# Patient Record
Sex: Male | Born: 1955 | Race: White | Hispanic: No | Marital: Married
Health system: Midwestern US, Academic
[De-identification: ages and names within clinical notes are randomized; demographics above are authoritative.]

---

## 2017-07-05 ENCOUNTER — Encounter: Admit: 2017-07-05 | Discharge: 2017-07-05 | Payer: BC Managed Care – PPO

## 2017-07-05 ENCOUNTER — Ambulatory Visit: Admit: 2017-07-05 | Discharge: 2017-07-06 | Payer: BC Managed Care – PPO

## 2017-07-05 DIAGNOSIS — Z8249 Family history of ischemic heart disease and other diseases of the circulatory system: ICD-10-CM

## 2017-07-05 DIAGNOSIS — R079 Chest pain, unspecified: ICD-10-CM

## 2017-07-05 DIAGNOSIS — G4733 Obstructive sleep apnea (adult) (pediatric): ICD-10-CM

## 2017-07-05 DIAGNOSIS — I517 Cardiomegaly: ICD-10-CM

## 2017-07-05 DIAGNOSIS — Z01812 Encounter for preprocedural laboratory examination: Principal | ICD-10-CM

## 2017-07-05 DIAGNOSIS — G2581 Restless legs syndrome: ICD-10-CM

## 2017-07-05 DIAGNOSIS — E782 Mixed hyperlipidemia: ICD-10-CM

## 2017-07-05 DIAGNOSIS — I10 Essential (primary) hypertension: ICD-10-CM

## 2017-07-05 DIAGNOSIS — I739 Peripheral vascular disease, unspecified: ICD-10-CM

## 2017-07-05 DIAGNOSIS — E119 Type 2 diabetes mellitus without complications: Principal | ICD-10-CM

## 2017-07-05 DIAGNOSIS — M4800 Spinal stenosis, site unspecified: ICD-10-CM

## 2017-07-05 MED ORDER — LIDOCAINE (PF) 10 MG/ML (1 %) IJ SOLN
.1-2 mL | INTRAMUSCULAR | 0 refills | Status: CN | PRN
Start: 2017-07-05 — End: ?

## 2017-07-05 MED ORDER — ASPIRIN 81 MG PO TBEC
81 mg | ORAL_TABLET | Freq: Every day | ORAL | 3 refills | Status: AC
Start: 2017-07-05 — End: ?

## 2017-07-05 MED ORDER — IMS MIXTURE TEMPLATE
60 mg | Freq: Once | ORAL | 0 refills | Status: CN
Start: 2017-07-05 — End: ?

## 2017-07-05 MED ORDER — PREDNISONE 20 MG PO TAB
ORAL_TABLET | 0 refills | Status: SS
Start: 2017-07-05 — End: 2017-07-20

## 2017-07-05 NOTE — Progress Notes
Date of Service: 07/05/2017    Gregory Acosta is a 61 y.o. male.       HPI     Gregory Acosta is a 61 year old male who I am seeing today in the office for evaluation.  Over the last couple years, he has been noted to have exertional symptoms of shortness of breath and chest heaviness.  His symptoms have been rather progressive and is been worked up primarily for a pulmonic etiology.  He had previously had a CT scan which suggested an enlarged pulmonary trunk with no evidence of right ventricular strain.  His most recent echocardiogram demonstrated an ejection fraction of 70% with an estimated peak pulmonary artery pressure of 34 mmHg.  He was recently seen and had a CT scan which demonstrated dense coronary calcification and he has poorly controlled diabetes.  Discussing with him his symptoms, they sound very typical for exertional angina which is been progressive in nature.  His blood pressures been poorly controlled and he is not on pharmacotherapy for his cholesterol.  He states that he has been intolerant to statins and has significant muscle discomfort and is not willing to try a statin at the current time.  He denies any orthopnea or paroxysmal nocturnal dyspnea at the current time.  He does have obstructive sleep apnea and will intermittently use a CPAP machine.         Vitals:    07/05/17 1447   BP: 150/86   Pulse: 96   Weight: (!) 137.3 kg (302 lb 12.8 oz)   Height: 1.829 m (6')     Body mass index is 41.07 kg/m???.     Past Medical History  Patient Active Problem List    Diagnosis Date Noted   ??? Left ventricular hypertrophy 07/05/2017     08/20/2016 Echo ([facility]): Normal EF, 70%. Moderate concentric left ventricular hypertrophy. No significant valvular abnormalities.      ??? Type 2 diabetes mellitus without complication, without long-term current use of insulin (HCC) 07/05/2017   ??? Essential hypertension 07/05/2017   ??? Mixed hyperlipidemia 07/05/2017   ??? Obstructive sleep apnea syndrome 07/05/2017 Uses CPAP.      ??? Family history of coronary artery disease 07/05/2017   ??? Venous insufficiency of both lower extremities 07/05/2017   ??? Chest pain 07/05/2017         Review of Systems   Constitution: Positive for malaise/fatigue and night sweats.   HENT: Positive for hearing loss and tinnitus.    Eyes: Positive for blurred vision, double vision and photophobia.   Cardiovascular: Positive for chest pain, claudication, dyspnea on exertion, leg swelling and orthopnea.   Respiratory: Positive for cough, shortness of breath, sleep disturbances due to breathing and wheezing.    Endocrine: Positive for polydipsia.   Hematologic/Lymphatic: Negative.    Skin: Positive for dry skin and poor wound healing.   Musculoskeletal: Positive for arthritis, back pain, joint pain, muscle cramps, muscle weakness and stiffness.   Gastrointestinal: Negative.    Genitourinary: Negative.    Neurological: Positive for numbness and paresthesias.   Psychiatric/Behavioral: Negative.    Allergic/Immunologic: Negative.        Physical Exam  Physical Exam   General Appearance: alert and oriented, no acute distress  Skin: warm, moist, no ulcers  Head: normocephalic, symmetric  Eyes: EOMI, PERRL, sclera are clear and without icterus  ENT: unremarkable, nares patent  Neck Veins: neck veins are flat, neck veins are not distended  Carotid Arteries: normal carotid upstroke bilaterally, no  bruits  Chest Inspection: chest is normal in appearance  Auscultation/Percussion: lungs clear to auscultation, no rales, rhonchi, wheezes or friction rub appreciated  Cardiac Rhythm: regular rhythm and normal rate  Cardiac Auscultation: Normal S1 & S2, no S3 or S4, no rub - normal pmi  Murmurs: no cardiac murmurs   Extremities: trace lower extremity edema bilaterally; 2+ symmetric distal pulses  Muskuloskeletal: no obvious deformity  Abdominal Exam: soft, non-tender, no masses, bowel sounds normal  Neurologic Exam: neurological assessment grossly intact Mood and Affect: Appropriate    Problems Addressed Today  Encounter Diagnoses   Name Primary?   ??? Coronary artery disease involving native heart with angina pectoris, unspecified vessel or lesion type (HCC) Yes   ??? Left ventricular hypertrophy    ??? Chest pain, unspecified type        Assessment and Plan     1. Abnormal CT scan demonstrating dense coronary calcium with symptoms typical of angina -I would like to repeat an echocardiogram to ensure the cardiac structure and function are unchanged.  We will plan to proceed with a cardiac catheterization with possible intervention as needed.  We discussed the importance of risk factor management and certainly his diabetes, hypertension and cholesterol are going to put him at increased risk for progression.  He is seeing an endocrinologist and is made good progress with regards to his hemoglobin A1c, but self-admittedly has difficulties with his diet.  After his cardiac catheterization we will plan to recommend an aggressive risk factor management plan.  I suspect that he has significant obstructive coronary disease which is leading to his symptoms of both shortness of breath and chest heaviness.         Current Medications (including today's revisions)  ??? aspirin EC 81 mg tablet Take 1 tablet by mouth daily. Take with food.   ??? furosemide (LASIX) 40 mg tablet Take 40 mg by mouth every morning.   ??? glimepiride (AMARYL) 4 mg tablet Take 4 mg by mouth twice daily with meals.   ??? insulin detemir(+) (LEVEMIR) 100 unit/mL soln Inject 80 Units under the skin twice daily before meals.   ??? insulin lispro(+) (HUMALOG KWIKPEN INSULIN) 100 unit/mL injection PEN Inject 50 Units under the skin three times daily with meals.   ??? losartan(+) (COZAAR) 100 mg tablet Take 100 mg by mouth daily.   ??? metFORMIN-ER(+) (FORTAMET) 1,000 mg extended release tablet Take 1,000 mg by mouth twice daily.   ??? pramipexole (MIRAPEX) 0.25 mg tablet Take 0.5 mg by mouth daily. ??? prednisone (DELTASONE) 20 mg tablet Take 60 mg (3 tabs) the evening before your cath and take 60 mg (3 tabs) the morning of your cath.

## 2017-07-06 ENCOUNTER — Ambulatory Visit: Admit: 2017-07-06 | Discharge: 2017-07-06 | Payer: BC Managed Care – PPO

## 2017-07-06 DIAGNOSIS — E119 Type 2 diabetes mellitus without complications: ICD-10-CM

## 2017-07-06 DIAGNOSIS — R079 Chest pain, unspecified: Principal | ICD-10-CM

## 2017-07-06 DIAGNOSIS — R0602 Shortness of breath: ICD-10-CM

## 2017-07-06 DIAGNOSIS — I25119 Atherosclerotic heart disease of native coronary artery with unspecified angina pectoris: Secondary | ICD-10-CM

## 2017-07-06 DIAGNOSIS — R931 Abnormal findings on diagnostic imaging of heart and coronary circulation: Principal | ICD-10-CM

## 2017-07-06 DIAGNOSIS — R0789 Other chest pain: ICD-10-CM

## 2017-07-06 DIAGNOSIS — I517 Cardiomegaly: ICD-10-CM

## 2017-07-06 DIAGNOSIS — I10 Essential (primary) hypertension: ICD-10-CM

## 2017-07-10 ENCOUNTER — Encounter: Admit: 2017-07-10 | Discharge: 2017-07-10 | Payer: BC Managed Care – PPO

## 2017-07-10 DIAGNOSIS — R0789 Other chest pain: Secondary | ICD-10-CM

## 2017-07-10 DIAGNOSIS — R0600 Dyspnea, unspecified: ICD-10-CM

## 2017-07-10 DIAGNOSIS — G2581 Restless legs syndrome: ICD-10-CM

## 2017-07-10 DIAGNOSIS — I10 Essential (primary) hypertension: ICD-10-CM

## 2017-07-10 DIAGNOSIS — I517 Cardiomegaly: ICD-10-CM

## 2017-07-10 DIAGNOSIS — E782 Mixed hyperlipidemia: ICD-10-CM

## 2017-07-10 DIAGNOSIS — I739 Peripheral vascular disease, unspecified: ICD-10-CM

## 2017-07-10 DIAGNOSIS — G4733 Obstructive sleep apnea (adult) (pediatric): ICD-10-CM

## 2017-07-10 DIAGNOSIS — R079 Chest pain, unspecified: ICD-10-CM

## 2017-07-10 DIAGNOSIS — E119 Type 2 diabetes mellitus without complications: Principal | ICD-10-CM

## 2017-07-10 DIAGNOSIS — I2510 Atherosclerotic heart disease of native coronary artery without angina pectoris: ICD-10-CM

## 2017-07-10 DIAGNOSIS — Z8249 Family history of ischemic heart disease and other diseases of the circulatory system: ICD-10-CM

## 2017-07-10 DIAGNOSIS — M4800 Spinal stenosis, site unspecified: ICD-10-CM

## 2017-07-10 LAB — POC TROPONIN: Lab: 0 ng/mL (ref 0.00–0.05)

## 2017-07-10 LAB — COMPREHENSIVE METABOLIC PANEL
Lab: 135 MMOL/L — ABNORMAL LOW (ref >60)
Lab: 143 mg/dL — ABNORMAL HIGH (ref 70–100)
Lab: 97 MMOL/L — ABNORMAL LOW (ref 98–110)

## 2017-07-10 LAB — D-DIMER: Lab: 283 ng{FEU}/mL (ref <500)

## 2017-07-10 LAB — CBC AND DIFF
Lab: 0.1 10*3/uL (ref 0–0.20)
Lab: 0.2 10*3/uL (ref 0–0.45)
Lab: 14 10*3/uL — ABNORMAL HIGH (ref >60)

## 2017-07-10 MED ORDER — HEPARIN (PORCINE) IN 5 % DEX 20,000 UNIT/500 ML (40 UNIT/ML) IV SOLP
0-2000 [IU]/h | INTRAVENOUS | 0 refills | Status: DC
Start: 2017-07-10 — End: 2017-07-14
  Administered 2017-07-11 (×2): 1000 [IU]/h via INTRAVENOUS
  Administered 2017-07-12: 14:00:00 1700 [IU]/h via INTRAVENOUS
  Administered 2017-07-12: 03:00:00 1400 [IU]/h via INTRAVENOUS
  Administered 2017-07-13 (×2): 1700 [IU]/h via INTRAVENOUS
  Administered 2017-07-13 – 2017-07-14 (×2): 1800 [IU]/h via INTRAVENOUS

## 2017-07-10 MED ORDER — MORPHINE (PF) 1 MG/ML 2 ML IV SYRG
4 mg | Freq: Once | INTRAVENOUS | 0 refills | Status: DC
Start: 2017-07-10 — End: 2017-07-10

## 2017-07-10 MED ORDER — ALBUTEROL SULFATE 90 MCG/ACTUATION IN HFAA
2 | RESPIRATORY_TRACT | 0 refills | Status: DC | PRN
Start: 2017-07-10 — End: 2017-07-14

## 2017-07-10 MED ORDER — LOSARTAN 50 MG PO TAB
100 mg | Freq: Every day | ORAL | 0 refills | Status: DC
Start: 2017-07-10 — End: 2017-07-14
  Administered 2017-07-11 – 2017-07-13 (×3): 100 mg via ORAL

## 2017-07-10 MED ORDER — METHYLPREDNISOLONE SOD SUC(PF) 125 MG/2 ML IJ SOLR
125 mg | Freq: Once | INTRAVENOUS | 0 refills | Status: CP
Start: 2017-07-10 — End: ?
  Administered 2017-07-11: 125 mg via INTRAVENOUS

## 2017-07-10 MED ORDER — PRAMIPEXOLE 0.25 MG PO TAB
.5 mg | Freq: Every day | ORAL | 0 refills | Status: DC
Start: 2017-07-10 — End: 2017-07-13
  Administered 2017-07-11 – 2017-07-13 (×3): 0.5 mg via ORAL

## 2017-07-10 MED ORDER — NITROGLYCERIN IN 5 % DEXTROSE 50 MG/250 ML (200 MCG/ML) IV SOLN
.1-.5 ug/kg/min | INTRAVENOUS | 0 refills | Status: DC
Start: 2017-07-10 — End: 2017-07-14
  Administered 2017-07-11: 05:00:00 0.2 ug/kg/min via INTRAVENOUS
  Administered 2017-07-11: 22:00:00 0.5 ug/kg/min via INTRAVENOUS
  Administered 2017-07-12 (×2): 0.4 ug/kg/min via INTRAVENOUS
  Administered 2017-07-13 – 2017-07-14 (×3): 0.5 ug/kg/min via INTRAVENOUS

## 2017-07-10 MED ORDER — HEPARIN (PORCINE) BOLUS FOR CONTINUOUS INF (BAG)
20-40 [IU]/kg | INTRAVENOUS | 0 refills | Status: DC
Start: 2017-07-10 — End: 2017-07-14

## 2017-07-10 MED ORDER — IOPAMIDOL 76 % IV SOLN
70 mL | Freq: Once | INTRAVENOUS | 0 refills | Status: CP
Start: 2017-07-10 — End: ?
  Administered 2017-07-11: 01:00:00 70 mL via INTRAVENOUS

## 2017-07-10 MED ORDER — HEPARIN (PORCINE) BOLUS FOR CONTINUOUS INF (BAG)
4000 [IU] | Freq: Once | INTRAVENOUS | 0 refills | Status: CP
Start: 2017-07-10 — End: ?

## 2017-07-10 MED ORDER — MORPHINE 4 MG/ML IV CRTG
4 mg | Freq: Once | INTRAVENOUS | 0 refills | Status: CP
Start: 2017-07-10 — End: ?
  Administered 2017-07-10: 23:00:00 4 mg via INTRAVENOUS

## 2017-07-10 MED ORDER — DIPHENHYDRAMINE HCL 50 MG/ML IJ SOLN
25 mg | Freq: Once | INTRAVENOUS | 0 refills | Status: CP
Start: 2017-07-10 — End: ?
  Administered 2017-07-11: 25 mg via INTRAVENOUS

## 2017-07-10 MED ORDER — INSULIN GLARGINE 100 UNIT/ML (3 ML) SC INJ PEN
40 [IU] | Freq: Once | SUBCUTANEOUS | 0 refills | Status: CP
Start: 2017-07-10 — End: ?
  Administered 2017-07-11: 04:00:00 40 [IU] via SUBCUTANEOUS

## 2017-07-10 MED ORDER — NITROGLYCERIN 0.4 MG SL SUBL
.4 mg | Freq: Once | SUBLINGUAL | 0 refills | Status: CP
Start: 2017-07-10 — End: ?

## 2017-07-10 MED ORDER — ASPIRIN 81 MG PO TBEC
81 mg | Freq: Every day | ORAL | 0 refills | Status: DC
Start: 2017-07-10 — End: 2017-07-14
  Administered 2017-07-11 – 2017-07-13 (×2): 81 mg via ORAL

## 2017-07-10 MED ORDER — SODIUM CHLORIDE 0.9 % IJ SOLN
50 mL | Freq: Once | INTRAVENOUS | 0 refills | Status: CP
Start: 2017-07-10 — End: ?
  Administered 2017-07-11: 01:00:00 50 mL via INTRAVENOUS

## 2017-07-10 NOTE — ED Notes
CT to page this RN when ready for pt. Pt has documented mild allergy to IV contrast, and states "Yeah it just made me itch a little" to this RN. Dr. Edwena Feltyondra in contact with radiologist to verify if pt is appropriate to receive contrast.

## 2017-07-10 NOTE — ED Notes
Urinal left at bedside. MS at bedside speaking to pt and wife at this time.

## 2017-07-10 NOTE — ED Triage Notes
Pt presents to ED 32 with chief complaint of mid upper chest pain since last night that radiates across the chest and descends down LUE, and indigestion "I've had diarrhea and nausea". Pt reports being recently seen by PCP and dx with pulmonary hypertension. Pt reports hx of swollen BLE and presents today with no BLE edema. Pt denies SOB but does have "chest discomfort".    Belongings to be kept at bedside with wife, per pt request.

## 2017-07-11 LAB — URINALYSIS DIPSTICK
Lab: NEGATIVE K/UL — ABNORMAL HIGH (ref 3–12)
Lab: NEGATIVE MMOL/L (ref 21–30)
Lab: NEGATIVE U/L — ABNORMAL HIGH (ref 7–40)
Lab: NEGATIVE mL/min — ABNORMAL LOW (ref 1.0–4.8)
Lab: NEGATIVE mg/dL (ref 0.3–1.2)
Lab: POSITIVE mL/min — AB (ref 0–0.80)

## 2017-07-11 LAB — LACTIC ACID (BG - RAPID LACTATE)
Lab: 2.5 MMOL/L — ABNORMAL HIGH (ref 0.5–2.0)
Lab: 4.4 MMOL/L — ABNORMAL HIGH (ref 0.5–2.0)

## 2017-07-11 LAB — PTT (APTT)
Lab: 33 s (ref 21.0–39.0)
Lab: 36 s (ref 21.0–39.0)

## 2017-07-11 LAB — LIPID PROFILE
Lab: 204 mg/dL — ABNORMAL HIGH (ref <100)
Lab: 282 mg/dL — ABNORMAL HIGH (ref <200)
Lab: 329 mg/dL — ABNORMAL HIGH (ref <150)
Lab: 39 mg/dL — ABNORMAL LOW (ref >40)

## 2017-07-11 LAB — POC GLUCOSE
Lab: 130 mg/dL — ABNORMAL HIGH (ref 70–100)
Lab: 224 mg/dL — ABNORMAL HIGH (ref 70–100)
Lab: 240 mg/dL — ABNORMAL HIGH (ref 70–100)
Lab: 258 mg/dL — ABNORMAL HIGH (ref 70–100)
Lab: 278 mg/dL — ABNORMAL HIGH (ref 70–100)
Lab: 278 mg/dL — ABNORMAL HIGH (ref 70–100)
Lab: 306 mg/dL — ABNORMAL HIGH (ref 70–100)
Lab: 313 mg/dL — ABNORMAL HIGH (ref 70–100)
Lab: 321 mg/dL — ABNORMAL HIGH (ref 70–100)
Lab: 343 mg/dL — ABNORMAL HIGH (ref 70–100)
Lab: 355 mg/dL — ABNORMAL HIGH (ref 70–100)
Lab: 365 mg/dL — ABNORMAL HIGH (ref 70–100)

## 2017-07-11 LAB — COMPREHENSIVE METABOLIC PANEL: Lab: 130 MMOL/L — ABNORMAL LOW (ref 137–147)

## 2017-07-11 LAB — PROTIME INR (PT): Lab: 1 (ref 0.8–1.2)

## 2017-07-11 LAB — TROPONIN-I
Lab: 0 ng/mL (ref 0.0–0.05)
Lab: 0 ng/mL — ABNORMAL HIGH (ref >60)
Lab: 0 ng/mL — ABNORMAL HIGH (ref <0.4)
Lab: 0 ng/mL (ref >60)

## 2017-07-11 LAB — BETA HYDROXYBUTYRATE (KETONES): Lab: 0.9 MMOL/L — ABNORMAL HIGH (ref <0.3)

## 2017-07-11 LAB — BASIC METABOLIC PANEL
Lab: 1 mg/dL (ref 0.4–1.24)
Lab: 133 MMOL/L — ABNORMAL LOW (ref 137–147)
Lab: 23 MMOL/L (ref 21–30)
Lab: 373 mg/dL — ABNORMAL HIGH (ref 70–100)
Lab: 4.7 MMOL/L (ref >60)
Lab: 9.2 mg/dL (ref 8.5–10.6)
Lab: 99 MMOL/L (ref >60)
Lab: >60 mL/min (ref >60)
Lab: >60 mL/min (ref >60)

## 2017-07-11 LAB — POC TROPONIN: Lab: 0 ng/mL — ABNORMAL HIGH (ref 0.00–0.05)

## 2017-07-11 LAB — 25-OH VITAMIN D (D2 + D3): Lab: 13 ng/mL — ABNORMAL LOW (ref 30–80)

## 2017-07-11 LAB — URINALYSIS, MICROSCOPIC

## 2017-07-11 LAB — HEMOGLOBIN A1C: Lab: 10 % — ABNORMAL HIGH (ref 4.0–6.0)

## 2017-07-11 LAB — MAGNESIUM
Lab: 2 mg/dL (ref 1.6–2.6)
Lab: 2.4 mg/dL — ABNORMAL LOW (ref >60)

## 2017-07-11 LAB — CBC AND DIFF: Lab: 12 K/UL — ABNORMAL HIGH (ref 4.5–11.0)

## 2017-07-11 LAB — TSH WITH FREE T4 REFLEX: Lab: 2.4 uU/mL (ref 0.35–5.00)

## 2017-07-11 MED ORDER — DEXTROSE 5 %-LACTATED RINGERS IV SOLP
500 mL | INTRAVENOUS | 0 refills | Status: DC
Start: 2017-07-11 — End: 2017-07-12

## 2017-07-11 MED ORDER — INSULIN ASPART 100 UNIT/ML SC FLEXPEN
0-14 [IU] | Freq: Before meals | SUBCUTANEOUS | 0 refills | Status: DC
Start: 2017-07-11 — End: 2017-07-11
  Administered 2017-07-11: 12:00:00 12 [IU] via SUBCUTANEOUS

## 2017-07-11 MED ORDER — INSULIN ASPART 100 UNIT/ML SC FLEXPEN
8 [IU] | SUBCUTANEOUS | 0 refills | Status: CP
Start: 2017-07-11 — End: ?

## 2017-07-11 MED ORDER — METOPROLOL TARTRATE 25 MG PO TAB
25 mg | Freq: Two times a day (BID) | ORAL | 0 refills | Status: DC
Start: 2017-07-11 — End: 2017-07-14
  Administered 2017-07-11 – 2017-07-14 (×6): 25 mg via ORAL

## 2017-07-11 MED ORDER — INSULIN 100UNITS NS 100ML
1-32 [IU]/h | INTRAVENOUS | 0 refills | Status: DC
Start: 2017-07-11 — End: 2017-07-14
  Administered 2017-07-11 (×2): 3 [IU]/h via INTRAVENOUS
  Administered 2017-07-12 (×2): 14.5 [IU]/h via INTRAVENOUS
  Administered 2017-07-12: 21:00:00 8 [IU]/h via INTRAVENOUS
  Administered 2017-07-13: 06:00:00 7.5 [IU]/h via INTRAVENOUS
  Administered 2017-07-13: 20:00:00 8 [IU]/h via INTRAVENOUS
  Administered 2017-07-13: 06:00:00 7.5 [IU]/h via INTRAVENOUS
  Administered 2017-07-13: 20:00:00 8 [IU]/h via INTRAVENOUS

## 2017-07-11 MED ORDER — FLUTICASONE 110 MCG/ACTUATION IN HFAA
2 | Freq: Two times a day (BID) | RESPIRATORY_TRACT | 0 refills | Status: DC
Start: 2017-07-11 — End: 2017-07-14
  Administered 2017-07-11: 11:00:00 2 via RESPIRATORY_TRACT

## 2017-07-11 MED ORDER — PRAMIPEXOLE 0.25 MG PO TAB
.5 mg | Freq: Once | ORAL | 0 refills | Status: CP
Start: 2017-07-11 — End: ?
  Administered 2017-07-11: 21:00:00 0.5 mg via ORAL

## 2017-07-11 MED ORDER — INSULIN GLARGINE 100 UNIT/ML (3 ML) SC INJ PEN
80 [IU] | Freq: Two times a day (BID) | SUBCUTANEOUS | 0 refills | Status: DC
Start: 2017-07-11 — End: 2017-07-11

## 2017-07-11 MED ORDER — LACTATED RINGERS IV SOLP
INTRAVENOUS | 0 refills | Status: DC
Start: 2017-07-11 — End: 2017-07-12
  Administered 2017-07-11 – 2017-07-12 (×5): 1000.000 mL via INTRAVENOUS

## 2017-07-11 MED ORDER — ROSUVASTATIN 10 MG PO TAB
10 mg | ORAL | 0 refills | Status: DC
Start: 2017-07-11 — End: 2017-07-20
  Administered 2017-07-12 – 2017-07-20 (×4): 10 mg via ORAL

## 2017-07-11 MED ORDER — INSULIN GLARGINE 100 UNIT/ML (3 ML) SC INJ PEN
60 [IU] | Freq: Two times a day (BID) | SUBCUTANEOUS | 0 refills | Status: DC
Start: 2017-07-11 — End: 2017-07-11

## 2017-07-11 MED ORDER — INSULIN DETEMIR U-100 100 UNIT/ML SC SOLN
80 [IU] | Freq: Two times a day (BID) | SUBCUTANEOUS | 0 refills | Status: DC
Start: 2017-07-11 — End: 2017-07-11

## 2017-07-11 MED ORDER — EZETIMIBE 10 MG PO TAB
10 mg | Freq: Every day | ORAL | 0 refills | Status: DC
Start: 2017-07-11 — End: 2017-07-14
  Administered 2017-07-11 – 2017-07-13 (×3): 10 mg via ORAL

## 2017-07-11 MED ORDER — DEXTROSE 5 %-LACTATED RINGERS IV SOLP
500 mL | INTRAVENOUS | 0 refills | Status: CP
Start: 2017-07-11 — End: ?
  Administered 2017-07-12: 02:00:00 500 mL via INTRAVENOUS

## 2017-07-11 MED ADMIN — NITROGLYCERIN 0.4 MG SL SUBL [5604]: 0.4 mg | SUBLINGUAL | @ 03:00:00 | Stop: 2017-07-11 | NDC 00071041813

## 2017-07-11 NOTE — Other
Critical result or procedure called (document test and value, and read back):  Lactic Acid 4.4, read back done  Time MD/NP Notified:  1800  MD/NP Name:  Garner Nashaniels, MD  MD/NP Response/Orders Given:  No additional orders given at this time.

## 2017-07-11 NOTE — ED Notes
[id number] room ready at 2157. Call RedlandShelby, RN at [phone]

## 2017-07-11 NOTE — Progress Notes
Pt states he has no pain at this time. VSS.  Voided clear yellow urine.  UA sent to lab.  AM labs sent.  FSBG 365, Dr. Allena KatzPatel notified, orders for SSI being placed.      Gtts  Heparin at 1000 units/hr (PTT sent with AM lab, 6 hours after infusion started)  NTG at 0.2 mcg/kg/min

## 2017-07-11 NOTE — ED Notes
Per Dr. Edwena Feltyondra - to consult with Dr. Johnnye Lanaavlantes prior to pt having CT scan.

## 2017-07-11 NOTE — Progress Notes
Admitted pt to CICU 04 from ED with unstable angina at 2249.  Pt alert and oriented x4.  Able to walk to bed.  Stated he was having a "twinge" of chest pain on arrival which went away and he stated he had no pain.  Monitor shows SR, rate 80s.  SBP 130-140s.  Afebrile.  On RA.  Pt brought home CPAP unit which he uses for OSA, set up and started when he was ready to sleep.  1L O2 added for desaturation below 90% while asleep.  NPO per order.  FSBG x5 daily.  Lantus insulin given per order (1/2 of home dose ordered).  Pt has not yet voided.  2 peripheral IVs in place for access.  Pt and wife oriented to unit, questions answered, plan of care for the night discussed.      Gtts  NTG started at 0.2 mcg/kg/min  Heparin infusing at 1000 units/hr

## 2017-07-11 NOTE — Progress Notes
Critical Care Progress Note          Today's Date:  07/11/2017  Name:  Gregory Acosta                       MRN:  [id number]   Admission Date: 07/10/2017  LOS: 1 day                     Assessment/Plan:   Active Problems:    Unstable angina [facility])        61 yo M who presented with worsening chest pain/pressure concerning for unstable angina. Troponin negative, no history of prior CAD but significant risk factors and family history of CAD.    Neuro: Nitroglycerin relieved chest pain, nitroglycerin infusion.   Cardiac: Unstable angina with improved chest pain on heparin and nitroglycerin. Had been planned for [facility] on 7/25, may need earlier with worsening symptoms. Losartan for hypertension  Pulmonary: Recently evaluated for worsening dyspnea on exertion and no evidence of obstructive lung disease. IS and pulm hygiene here, continue flovent.  FEN: NPO now, if no procedure today can resume diet and ADAT on cardiac/diabetic diet. Having some nausea and diarrhea at home.   ID:  No evidence of active infection.  Renal:  Normal kidney function, metabolic acidosis may be related to ketoacidosis, bicarb loss with diarrhea and possible hypovolemia.  Hyperkalemia, rechecking now. May be related to worsening acidosis with potassium shift.  Heme:  On heparin drip, continue.  Endo:  Uncontrolled diabetes, may have mild DKA. Follow up beta hydroxybutyrate and BMP for acidosis.  If worsening glucose control can start insulin drip if needed and ensure adequate hydration.  Prophylaxis: HOB>40, PPI not indicated, DVT prophylaxis with heparin infusion  Disposition/Family: Needs ICU for unstable angina and worsening metabolic acidosis  __________________________________________________________________________________  Subjective:  Gregory Acosta is a 61 y.o. male.  Admitted for unstable angina  Objective:  Medications:  Scheduled Meds:  aspirin EC tablet 81 mg 81 mg Oral QDAY fluticasone (FLOVENT HFA) 110 mcg/actuation inhaler 2 puff 2 puff Inhalation BID   heparin (porcine) BOLUS for continuous inf (bag) [phone] Units 20-40 Units/kg Intravenous As Prescribed   INHALATIONAL SPACING DEVICE MISC SPCR (Cabinet Override)   NOW   insulin aspart U-100 (NOVOLOG FLEXPEN) injection PEN 0-14 Units 0-14 Units Subcutaneous ACHS   insulin glargine (LANTUS SOLOSTAR, BASAGLAR) injection PEN 80 Units 80 Units Subcutaneous BID   losartan (COZAAR) tablet 100 mg 100 mg Oral QDAY   pramipexole (MIRAPEX) tablet 0.5 mg 0.5 mg Oral QDAY   Continuous Infusions:  ??? heparin (porcine) 20,000 units/D5W 500 mL infusion (std conc)(premade) 1,000 Units/hr (07/11/17 0724)   ??? nitroGLYCERIN 50 mg/D5W 250 mL infusion 0.5 mcg/kg/min (07/11/17 0723)     PRN and Respiratory Meds:albuterol Q4H PRN                     Vital Signs: Last Filed                  Vital Signs: 24 Hour Range   BP: 125/67 (07/15 0615)  Temp: 36.5 ???C (97.7 ???F) (07/15 0400)  Pulse: 88 (07/15 0700)  Respirations: 15 PER MINUTE (07/15 0700)  SpO2: 99 % (07/15 0700)  O2 Delivery: Nasal Cannula (07/15 0615)  Height: 182.9 cm (72) (07/14 2327)  Weight: 134.1 kg (295 lb 9.6 oz) (07/14 2327)  BP: (118-174)/(58-97)   Temp:  [36.5 ???C (97.7 ???F)-37 ???C (98.6 ???F)]   Pulse:  [78-99]  Respirations:  [0 PER MINUTE-23 PER MINUTE]   SpO2:  [89 %-100 %]   O2 Delivery: Nasal Cannula    Intensity Pain Scale 0-10 (Pain 1): 1 (07/11/17 0655) Vitals:    07/10/17 1600 07/10/17 2327   Weight: (!) 137.9 kg (304 lb) 134.1 kg (295 lb 9.6 oz)       Critical Care Vitals:      ICP Monitoring:     PA  Catheter:     Hemodynamics/Oxycalcs:       Intake/Output Summary:  (Last 24 hours)    Intake/Output Summary (Last 24 hours) at 07/11/17 0836  Last data filed at 07/11/17 0700   Gross per 24 hour   Intake           555.85 ml   Output             1950 ml   Net         -1394.15 ml         Physical Exam:  General:  mild distress, appears stated age Lungs:  Clear to auscultation bilaterally  CV: RRR  Abdomen:  Soft, non-tender.  Bowel sounds normal.  No masses.  No organomegaly.  Extremities:  Extremities normal, atraumatic, no cyanosis or edema  Neurologic:  CNII - XII intact.  PERRL      Prophylaxis Review:  Lines:  No  Urinary Catheter:  No  Antibiotic Usage:  No  VTE: heparin infusion    Lab Review:  Pertinent labs reviewed  Point of Care Testing:  (Last 24 hours):  Glucose: (!) 395 (07/11/17 0320)  POC Glucose (Download): (!) 355 (07/11/17 1478)    Radiology and Other Diagnostic Procedures Review:    Pertinent radiology reviewed.    I have seen, examined and reviewed data concerning this patient.  I discussed the findings and plan of care with the ICU team. I spent 45 minutes in critical care time, excluding procedures today.    Haze Rushing, MD  Assistant Professor  Anesthesiology and Critical Care

## 2017-07-11 NOTE — Progress Notes
Following Heparin Protocol on MAR:  6 hours after heparin infusion started:  PTT 33.4 on AM lab  Bolus of 40 units/kg given from IV bag:   5516 units IV given at 0439.  Heparin gtt rate remains at 1000 units/hr, as orders are gtt not to exceed 1000 units/hr for 1st 12 hours of infusion.    Next PTT will be due in 6 hours at 1039.

## 2017-07-12 ENCOUNTER — Ambulatory Visit: Admit: 2017-07-12 | Discharge: 2017-07-12 | Payer: BC Managed Care – PPO

## 2017-07-12 ENCOUNTER — Encounter: Admit: 2017-07-12 | Discharge: 2017-07-12 | Payer: BC Managed Care – PPO

## 2017-07-12 ENCOUNTER — Inpatient Hospital Stay: Admit: 2017-07-12 | Discharge: 2017-07-12 | Payer: BC Managed Care – PPO

## 2017-07-12 DIAGNOSIS — Z8249 Family history of ischemic heart disease and other diseases of the circulatory system: ICD-10-CM

## 2017-07-12 DIAGNOSIS — R079 Chest pain, unspecified: ICD-10-CM

## 2017-07-12 DIAGNOSIS — E119 Type 2 diabetes mellitus without complications: Principal | ICD-10-CM

## 2017-07-12 DIAGNOSIS — I10 Essential (primary) hypertension: ICD-10-CM

## 2017-07-12 DIAGNOSIS — G2581 Restless legs syndrome: ICD-10-CM

## 2017-07-12 DIAGNOSIS — M4800 Spinal stenosis, site unspecified: ICD-10-CM

## 2017-07-12 DIAGNOSIS — I517 Cardiomegaly: ICD-10-CM

## 2017-07-12 DIAGNOSIS — I739 Peripheral vascular disease, unspecified: ICD-10-CM

## 2017-07-12 DIAGNOSIS — G4733 Obstructive sleep apnea (adult) (pediatric): ICD-10-CM

## 2017-07-12 DIAGNOSIS — E782 Mixed hyperlipidemia: ICD-10-CM

## 2017-07-12 DIAGNOSIS — R0789 Other chest pain: Secondary | ICD-10-CM

## 2017-07-12 LAB — POC GLUCOSE
Lab: 101 mg/dL — ABNORMAL HIGH (ref 70–100)
Lab: 115 mg/dL — ABNORMAL HIGH (ref 70–100)
Lab: 119 mg/dL — ABNORMAL HIGH (ref 70–100)
Lab: 121 mg/dL — ABNORMAL HIGH (ref 70–100)
Lab: 126 mg/dL — ABNORMAL HIGH (ref 70–100)
Lab: 133 mg/dL — ABNORMAL HIGH (ref 70–100)
Lab: 137 mg/dL — ABNORMAL HIGH (ref 70–100)
Lab: 140 mg/dL — ABNORMAL HIGH (ref 70–100)
Lab: 142 mg/dL — ABNORMAL HIGH (ref 70–100)
Lab: 142 mg/dL — ABNORMAL HIGH (ref 70–100)
Lab: 143 mg/dL — ABNORMAL HIGH (ref 70–100)
Lab: 145 mg/dL — ABNORMAL HIGH (ref 70–100)
Lab: 151 mg/dL — ABNORMAL HIGH (ref 70–100)
Lab: 152 mg/dL — ABNORMAL HIGH (ref 70–100)
Lab: 173 mg/dL — ABNORMAL HIGH (ref 70–100)
Lab: 173 mg/dL — ABNORMAL HIGH (ref 70–100)
Lab: 180 mg/dL — ABNORMAL HIGH (ref 70–100)
Lab: 196 mg/dL — ABNORMAL HIGH (ref 70–100)
Lab: 208 mg/dL — ABNORMAL HIGH (ref 70–100)
Lab: 245 mg/dL — ABNORMAL HIGH (ref 70–100)
Lab: 254 mg/dL — ABNORMAL HIGH (ref 70–100)
Lab: 261 mg/dL — ABNORMAL HIGH (ref 70–100)
Lab: 315 mg/dL — ABNORMAL HIGH (ref 70–100)
Lab: 95 mg/dL (ref 70–100)
Lab: 99 mg/dL (ref 70–100)

## 2017-07-12 LAB — BASIC METABOLIC PANEL
Lab: 0.9 mg/dL — ABNORMAL HIGH (ref >60)
Lab: 102 MMOL/L (ref 98–110)
Lab: 12 U/L (ref 3–12)
Lab: 136 MMOL/L — ABNORMAL LOW (ref 137–147)
Lab: 187 mg/dL — ABNORMAL HIGH (ref 70–100)
Lab: 22 MMOL/L (ref 21–30)
Lab: 24 mg/dL (ref >60)
Lab: 3.9 MMOL/L — ABNORMAL LOW (ref 3.5–5.1)
Lab: 9.1 mg/dL (ref 8.5–10.6)
Lab: >60 mL/min (ref >60)
Lab: >60 mL/min (ref >60)

## 2017-07-12 LAB — COMPREHENSIVE METABOLIC PANEL: Lab: 136 MMOL/L — ABNORMAL LOW (ref 137–147)

## 2017-07-12 LAB — MAGNESIUM: Lab: 2.1 mg/dL — ABNORMAL LOW (ref 1.6–2.6)

## 2017-07-12 LAB — TROPONIN-I: Lab: 0 ng/mL — ABNORMAL LOW (ref 0.0–0.05)

## 2017-07-12 LAB — PTT (APTT)
Lab: 39 s (ref 21.0–39.0)
Lab: 53 s — ABNORMAL HIGH (ref 21.0–39.0)
Lab: 65 s — ABNORMAL HIGH (ref 21.0–39.0)
Lab: 86 s — ABNORMAL HIGH (ref 21.0–39.0)

## 2017-07-12 LAB — CBC AND DIFF: Lab: 14 K/UL — ABNORMAL HIGH (ref 4.5–11.0)

## 2017-07-12 LAB — LACTIC ACID(LACTATE): Lab: 1.7 MMOL/L (ref 0.5–2.0)

## 2017-07-12 MED ORDER — DIPHENHYDRAMINE HCL 50 MG/ML IJ SOLN
25 mg | INTRAVENOUS | 0 refills | Status: DC | PRN
Start: 2017-07-12 — End: 2017-07-14

## 2017-07-12 MED ORDER — PRAMIPEXOLE 0.25 MG PO TAB
.25 mg | Freq: Once | ORAL | 0 refills | Status: CP
Start: 2017-07-12 — End: ?
  Administered 2017-07-12: 15:00:00 0.25 mg via ORAL

## 2017-07-12 MED ORDER — ALPRAZOLAM 0.5 MG PO TAB
.5 mg | Freq: Once | ORAL | 0 refills | Status: CP
Start: 2017-07-12 — End: ?
  Administered 2017-07-12: 05:00:00 0.5 mg via ORAL

## 2017-07-12 MED ORDER — POTASSIUM CHLORIDE 20 MEQ PO TBTQ
40 meq | Freq: Once | ORAL | 0 refills | Status: CP
Start: 2017-07-12 — End: ?
  Administered 2017-07-12: 11:00:00 40 meq via ORAL

## 2017-07-12 MED ORDER — ALPRAZOLAM 0.25 MG PO TAB
0.25 mg | Freq: Once | ORAL | 0 refills | Status: CP
Start: 2017-07-12 — End: ?
  Administered 2017-07-13: 01:00:00 0.25 mg via ORAL

## 2017-07-12 MED ORDER — ONDANSETRON HCL (PF) 4 MG/2 ML IJ SOLN
4 mg | INTRAVENOUS | 0 refills | Status: DC | PRN
Start: 2017-07-12 — End: 2017-07-14

## 2017-07-12 MED ORDER — SODIUM CHLORIDE 0.9 % IV SOLP
250 mL | INTRAVENOUS | 0 refills | Status: CN | PRN
Start: 2017-07-12 — End: ?

## 2017-07-12 MED ORDER — INSULIN ASPART 100 UNIT/ML SC FLEXPEN
25 [IU] | Freq: Three times a day (TID) | SUBCUTANEOUS | 0 refills | Status: DC
Start: 2017-07-12 — End: 2017-07-14
  Administered 2017-07-13: 25 [IU] via SUBCUTANEOUS

## 2017-07-12 MED ORDER — DIPHENHYDRAMINE HCL 50 MG PO CAP
50 mg | Freq: Once | ORAL | 0 refills | Status: CP
Start: 2017-07-12 — End: ?
  Administered 2017-07-12: 14:00:00 50 mg via ORAL

## 2017-07-12 MED ORDER — FAMOTIDINE 20 MG PO TAB
20 mg | Freq: Once | ORAL | 0 refills | Status: CP
Start: 2017-07-12 — End: ?
  Administered 2017-07-12: 14:00:00 20 mg via ORAL

## 2017-07-12 MED ORDER — IMS MIXTURE TEMPLATE
60 mg | Freq: Once | ORAL | 0 refills | Status: CP
Start: 2017-07-12 — End: ?
  Administered 2017-07-12 (×2): 60 mg via ORAL

## 2017-07-12 MED ORDER — POTASSIUM CHLORIDE 20 MEQ PO TBTQ
60 meq | Freq: Once | ORAL | 0 refills | Status: CP
Start: 2017-07-12 — End: ?
  Administered 2017-07-12: 14:00:00 60 meq via ORAL

## 2017-07-12 MED ORDER — PERFLUTREN LIPID MICROSPHERES 1.1 MG/ML IV SUSP
1-20 mL | Freq: Once | INTRAVENOUS | 0 refills | Status: CP
Start: 2017-07-12 — End: ?
  Administered 2017-07-12: 13:00:00 2 mL via INTRAVENOUS

## 2017-07-12 MED ORDER — METHYLPREDNISOLONE SOD SUC(PF) 125 MG/2 ML IJ SOLR
40 mg | Freq: Once | INTRAVENOUS | 0 refills | Status: CN
Start: 2017-07-12 — End: ?

## 2017-07-12 MED ORDER — ALUMINUM-MAGNESIUM HYDROXIDE 200-200 MG/5 ML PO SUSP
30 mL | ORAL | 0 refills | Status: DC | PRN
Start: 2017-07-12 — End: 2017-07-14

## 2017-07-12 MED ORDER — ASPIRIN 81 MG PO CHEW
324 mg | Freq: Once | ORAL | 0 refills | Status: CP
Start: 2017-07-12 — End: ?
  Administered 2017-07-12: 14:00:00 324 mg via ORAL

## 2017-07-12 MED ORDER — IMS MIXTURE TEMPLATE
60 mg | Freq: Two times a day (BID) | ORAL | 0 refills | Status: AC
Start: 2017-07-12 — End: ?
  Administered 2017-07-13 (×2): 60 mg via ORAL

## 2017-07-12 MED ORDER — LIDOCAINE (PF) 10 MG/ML (1 %) IJ SOLN
.1-2 mL | INTRAMUSCULAR | 0 refills | Status: DC | PRN
Start: 2017-07-12 — End: 2017-07-14

## 2017-07-12 MED ORDER — DIPHENHYDRAMINE HCL 25 MG PO CAP
25 mg | ORAL | 0 refills | Status: DC | PRN
Start: 2017-07-12 — End: 2017-07-14

## 2017-07-12 MED ORDER — INSULIN ASPART 100 UNIT/ML SC FLEXPEN
20 [IU] | Freq: Three times a day (TID) | SUBCUTANEOUS | 0 refills | Status: DC
Start: 2017-07-12 — End: 2017-07-12

## 2017-07-12 MED ORDER — ACETAMINOPHEN 325 MG PO TAB
650 mg | ORAL | 0 refills | Status: DC | PRN
Start: 2017-07-12 — End: 2017-07-14

## 2017-07-12 NOTE — Progress Notes
Pt left for left heart cath at this time with CCL RN.

## 2017-07-12 NOTE — Case Management (ED)
Case Management Progress Note    NAME:Gregory Acosta                          MRN: [id number]              DOB:[birth year]          AGE: 61 y.o.  ADMISSION DATE: 07/10/2017             DAYS ADMITTED: LOS: 2 days      Todays Date: 07/12/2017    Plan  Plan for cath and coronary angiogram     Interventions  ? Support      ? Info or Referral      ? Discharge Planning      ? Medication Needs      ? Financial      ? Legal   Legal: DPOA & Advance Directives      SW assisting pt with DPOA completion. Pt named his wife Myrna BlazerJulie Talaga [phone]) as his Primary DPOA with no alternate.      SW notarized DPOA and sent copy to Admissions.    ? Other        Disposition  ? Expected Discharge Date    Expected Discharge Date: 07/15/17  ? Transportation   Does the patient need discharge transport arranged?: No  Transportation Name, Phone and Availability #1: Raynelle FanningJulie (wife) [phone]  Does the patient use Medicaid Transportation?: No  ? Next Level of Care (Acute Psych discharges only)      ? Discharge Disposition                                          Durable Medical Equipment     No service has been selected for the patient.      [facility]     No service has been selected for the patient.      [facility]     No service has been selected for the patient.      [facility]     No service has been selected for the patient.          Redgie Grayerhristina Gean Larose, LMSW  p. [phone]

## 2017-07-12 NOTE — Progress Notes
Critical Care Progress Note          Today's Date:  07/12/2017  Name:  Gregory Acosta                       MRN:  [id number]   Admission Date: 07/10/2017  LOS: 2 days                     Assessment/Plan:   Active Problems:    Unstable angina [facility])        61 yo M who presented with worsening chest pain/pressure concerning for unstable angina. Troponin negative, no history of prior CAD but significant risk factors and family history of CAD.    Neuro: Nitroglycerin relieved chest pain, nitroglycerin infusion.   Cardiac: Unstable angina with improved chest pain on heparin and nitroglycerin. Wean nitro as able. 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 CTS consulted and planning for CABG mid week. Asa/BB/heparin gtt. Statin/zetia for HLD. Losartan for hypertension. TTE today with EF 50%.  Pulmonary: Recently evaluated for worsening dyspnea on exertion and no evidence of obstructive lung disease. IS and pulm hygiene here, continue flovent.  FEN: ADAT on cardiac/diabetic diet. Denies nausea.   ID:  No evidence of active infection. Leukocytosis likely secondary to steroids.  Renal:  Normal kidney function, anion gap has closed and metabolic acidosis has resolved after glycemic control and fluid resucitation, lactate has normalized. hypokalemia will need to be replaced to keep >4 and Mg >2. Monitor UOP.  Heme:  On heparin drip, continue.  Endo:  Uncontrolled diabetes, concern for DKA has resolved. Given insulin resistance at home and elevated A1c will consult endo for help in outpatient management. Planning to remain on insulin gtt + meal time dose until surgery.  Prophylaxis: HOB>40, PPI not indicated, DVT prophylaxis with heparin infusion  Disposition/Family: ICU for unstable angina.  __________________________________________________________________________________  Subjective:  Gregory Acosta is a 61 y.o. male.  Admitted for unstable angina  Objective:  Medications:  Scheduled Meds:    aspirin chewable tablet 324 mg 324 mg Oral ONCE aspirin EC tablet 81 mg 81 mg Oral QDAY   diphenhydrAMINE (BENADRYL) capsule 50 mg 50 mg Oral ONCE   ezetimibe (ZETIA) tablet 10 mg 10 mg Oral QDAY   famotidine (PEPCID) tablet 20 mg 20 mg Oral ONCE   fluticasone (FLOVENT HFA) 110 mcg/actuation inhaler 2 puff 2 puff Inhalation BID   heparin (porcine) BOLUS for continuous inf (bag) [phone] Units 20-40 Units/kg Intravenous As Prescribed   losartan (COZAAR) tablet 100 mg 100 mg Oral QDAY   metoprolol tartrate (LOPRESSOR) tablet 25 mg 25 mg Oral BID   perflutren lipid microspheres (DEFINITY) injection 1-20 Diluted mL 1-20 Diluted mL Intravenous ONCE   potassium chloride SR (K-DUR) tablet 60 mEq 60 mEq Oral ONCE   pramipexole (MIRAPEX) tablet 0.5 mg 0.5 mg Oral QDAY   prednisone (DELTASONE) tablet 60 mg 60 mg Oral Q12H   prednisone (DELTASONE) tablet 60 mg 60 mg Oral ONCE   rosuvastatin (CRESTOR) tablet 10 mg 10 mg Oral Once per day on Mon Thu   Continuous Infusions:  ??? heparin (porcine) 20,000 units/D5W 500 mL infusion (std conc)(premade) 1,600 Units/hr (07/12/17 0710)   ??? insulin regular (NOVOLIN R) 100 Units in sodium chloride 0.9% (NS) 100 mL IV drip (std conc) 8 Units/hr (07/12/17 0709)   ??? lactated ringers infusion 150 mL/hr at 07/12/17 0117   ??? nitroGLYCERIN 50 mg/D5W 250 mL infusion 0.4 mcg/kg/min (07/12/17 0031)  PRN and Respiratory Meds:albuterol Q4H PRN, lidocaine PF PRN                     Vital Signs: Last Filed                  Vital Signs: 24 Hour Range   BP: 125/68 (07/16 0700)  Temp: 36.7 ???C (98.1 ???F) (07/16 0400)  Pulse: 80 (07/16 0700)  Respirations: 28 PER MINUTE (07/15 1100)  SpO2: 99 % (07/16 0700)  O2 Delivery: CPAP/BiPAP (Pt Owned) (07/16 0600)  BP: (107-148)/(53-94)   Temp:  [36.5 ???C (97.7 ???F)-36.9 ???C (98.5 ???F)]   Pulse:  [69-94]   Respirations:  [11 PER MINUTE-28 PER MINUTE]   SpO2:  [92 %-99 %]   O2 Delivery: CPAP/BiPAP (Pt Owned)    Intensity Pain Scale 0-10 (Pain 1): (not recorded) Vitals:    07/10/17 1600 07/10/17 2327 Weight: (!) 137.9 kg (304 lb) 134.1 kg (295 lb 9.6 oz)       Critical Care Vitals:      ICP Monitoring:     PA  Catheter:     Hemodynamics/Oxycalcs:       Intake/Output Summary:  (Last 24 hours)    Intake/Output Summary (Last 24 hours) at 07/12/17 0731  Last data filed at 07/12/17 0700   Gross per 24 hour   Intake          5990.92 ml   Output             1550 ml   Net          4440.92 ml         Physical Exam:  General:  no distress, appears stated age  Lungs:  Clear to auscultation bilaterally  CV: RRR  Abdomen:  Soft, non-tender, obese.  Bowel sounds normal.  No masses.  No organomegaly.  Extremities:  Extremities normal, atraumatic, no cyanosis or edema  Neurologic:  CNII - XII intact.  PERRL      Prophylaxis Review:  Lines:  No  Urinary Catheter:  No  Antibiotic Usage:  No  VTE: heparin infusion    Lab Review:  Pertinent labs reviewed  Point of Care Testing:  (Last 24 hours):  Glucose: (!) 142 (07/12/17 0355)  POC Glucose (Download): (!) 126 (07/12/17 0645)    Radiology and Other Diagnostic Procedures Review:    Pertinent radiology reviewed.    I have seen, examined and reviewed data concerning this patient.  I discussed the findings and plan of care with the ICU team. I spent 35 minutes in critical care time, excluding procedures today.    Bard Herbert, MD  Anesthesiology and Critical Care  [phone]

## 2017-07-12 NOTE — Case Management (ED)
Case Management Admission Assessment    NAME:Gregory Acosta                          MRN: [id number]             DOB:Jul 05, 1956          AGE: 61 y.o.  ADMISSION DATE: 07/10/2017             DAYS ADMITTED: LOS: 2 days      Today???s Date: 07/12/2017    Source of Information: Patient and pt's wife Gregory Acosta     Plan  Plan: CM Assessment, Assist PRN with SW/NCM Services, Discharge Planning for Home Anticipated     Plan for cath and coronary angiogram today. Pt anticipated to d/c to home when medically stable. SW assisting pt with completion of DPOA and updating team that pt reports he does not have prescription coverage at this time.     Per chart, pt is 61 y.o. pleasant male with past medical history significant for uncontrolled diabetes mellitus (last HbA1c was 10.5), hypertension, hyperlipidemia (intolerant to statin), obstructive sleep apnea on CPAP, morbid obesity, family history significant for coronary artery disease who presented to emergency room with chief complaint of chest heaviness since last night.    ???  As per patient he was sleeping yesterday night and he woke up with chest heaviness which was substernal, radiating to his left shoulder and left upper arm, aggravating by turning towards right side or alleviating factors, associated with shortness of breath, non-pleuritic, nonreproducible in nature.  ???  Patient complained of severe intensity of chest pain, 8-9 out of 10 last night which was constant and continued throughout the day with minimal improvement.  Patient received morphine in emergency room with that his chest pain is currently 4 out of 10 in intensity.  ???  Prior to this, patient mentioned that like he has been having exertional dyspnea for many years for which he was extensively evaluated by pulmonary medicine at [facility] and they ruled out obstructive lung disease.  Recently since last few month patient has been also experiencing chest heaviness with exertion and rest along with shortness of breath.  Patient was recently seen by interventional cardiologist [name] in our office and he was scheduled for left heart catheterization as an outpatient on 07/21/2017 but unfortunately he ended up in emergency room with this episode of chest pain.  ???  No Orthopnea, PND, Leg swelling, Dizziness, headache, near syncopal or syncopal episodes.  No other complaints.    Patient Address/Phone  [address]  [place] [address]  [phone] (home)     Emergency Contact  Extended Emergency Contact Information  Primary Emergency Contact: [name], [name]  Home Phone: [phone]  Relation: Spouse    Healthcare Directive  Would patient like to fill out a (a new) Healthcare Directive?: Yes, referral to Social Work  Psych Proofreader (Psych unit only): No, patient does not have a Psych Advance Directive     Pt reports he would like to complete a new DPOA. SW assisting pt with DPOA completion. Pt named his wife Gregory Acosta [phone]) as his Primary DPOA with no alternate.       Transportation  Does the patient need discharge transport arranged?: No  Transportation Name, Phone and Availability #1: Gregory Acosta (wife) [phone]  Does the patient use Medicaid Transportation?: No     Pt reports his wife normally accompanies  him to appointments but he is able to drive himself to appointments as well    Expected Discharge Date  Expected Discharge Date: 07/15/17    Living Situation Prior to Admission  ? Living Arrangements  Type of Residence: Home, independent  Living Arrangements: Spouse/significant other  How many levels in the residence?: 1  Can patient live on one level if needed?: Yes  Does residence have entry and/or side stairs?: Yes (2)  Assistance needed prior to admit or anticipated on discharge: No  Who provides assistance or could if needed?: pt's wife Gregory Acosta  Are they in good health?: Unknown Can support system provide 24/7 care if needed?: Maybe  ? Level of Function   Prior level of function: Independent  ? Cognitive Abilities   Cognitive Abilities: Alert and Oriented, Engages in problem solving and planning, Participates in decision making, Understands nature of health condition     Pt and pt's wife deny concerns with d/c to home at this time.    Financial Resources  ? Coverage  Primary Insurance: Nurse, learning disability (BCBS [facility] PREF CARE)  Additional Coverage: None (Pt reports he does not have any Rx coverage, pt states he has a 304D plan through [facility] which helps with costs of his medications but the medications must be prescribed by [facility], pt reports his preferred pharmacy is [facility] for d/c meds). Pt and pt's wife report they should be able manage costs of d/c medications at d/c if there are new medications as long as costs are not more than a few hundred dollars. Pt's wife states if pt's insulin were to be changed they could likely follow-up with Dr. [name] at [facility] to have the prescriptions rewritten in order for him to fill at [facility] at a lower cost. Pt states his PCP is at [facility] and that he follows at [facility] for Cardiology.     SW updated rounding nurse Loren    ? Source of Income   Source Of Income: SSI (Pt reports he is retired)  ? Financial Assistance Needed?  Pt denies at this time    Psychosocial Needs  ? Mental Health  Mental Health History: No (Pt reports some current feelings of anxiety and depression related to his current hospitalization and related to not taking care of himself before, pt denies need for further support at this time and states the care he has been receiving has been great)  ? Substance Use History  Substance Use History Screen: No  ? Other  N/A    Current/Previous Services  ? PCP  [place], [place], [phone], [phone] - PCP, [facility]    Dr. Paris Lore [facility] Cardiology   Dr. [name] [facility] Endocrinology     ? [phone] [place] [place] - [address]  [address]  [place] [address]  Phone: [phone] Fax: [phone]    ? Durable Medical Equipment   Durable Medical Equipment at home: CPAP/BiPAP  ? Home Health  Receiving home health: No  ? Hemodialysis or Peritoneal Dialysis  Undergoing hemodialysis or peritoneal dialysis: No  ? Tube/Enteral Feeds  Receive tube/enteral feeds: No  ? Infusion  Receive infusions: No  ? Private Duty  Private duty help used: No  ? Home and Community Based Services  Home and community based services: No  ? [employer]: N/A  ? Hospice  Hospice: No  ? Outpatient Therapy  PT: In the past  When did patient receive care?:  following knee surgery  Name of rehab location/group: [facility]  OT: No  SLP: No  ? Skilled Nursing Facility/Nursing Home  SNF: No  NH: No  ? Inpatient Rehab  IPR: No  ? Long-Term Acute Care Hospital  LTACH: No  ? Acute Hospital Stay  Acute Hospital Stay: In the past  Was patient's stay within the last 30 days?: No  When did patient receive care?: 2015  Name of hospital: [facility]     Redgie Grayer, LMSW  p. [phone]

## 2017-07-12 NOTE — Consults
CTS CONSULT  Date of Service: 07/12/2017    Requesting Physician: Dr Micheline Rough  Consulting Physician: Dr Elias Else   Consult Performed By:  Lucille Passy PA-C     HPI:             This is a pleasant 61 year old male that presented to the ED on 7/14 complaining of chest heaviness and pressure.  He states that he has been experiencing exertional SOA and chest pressure over the past couple of months. He was evaluated as an outpatient by cardiology and had a cath scheduled for later this month but presented with unrelenting chest pain. He describes his episodes of chest pressure which radiates to left arm. Occasionally with pain 8/10.   His past medical history is significant for obesity, uncontrolled DM (A1C 10.5), HTN, HLD, and OSA. He is retired and works on his farm daily.  He denies any orthopnea, PND, palpitations or syncopal episodes.   ECHO on 7/16 EF 50% with mild diastolic dysfunction. No valvular issues.   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 He is currently on Nitro and heparin drips.   We have been asked to discuss possible surgical intervention.      STS RISK:  RISK SCORES  About the STS Risk Calculator  Procedure: CAB Only   Risk of Mortality: 0.716%   Morbidity or Mortality: 10.916%   Long Length of Stay: 4.477%   Short Length of Stay: 48.23%   Permanent Stroke: 0.496%   Prolonged Ventilation: 7.287%   DSW Infection: 1.068%   Renal Failure: 2.132%   Reoperation: 3.486%           Past Medical History:   Diagnosis Date   ??? Chest pain    ??? Essential hypertension 07/05/2017   ??? Family history of coronary artery disease 07/05/2017   ??? Left ventricular hypertrophy 07/05/2017    08/20/2016 Echo ([facility]): Normal EF, 70%. Moderate concentric left ventricular hypertrophy. No significant valvular abnormalities.    ??? Mixed hyperlipidemia 07/05/2017   ??? Obstructive sleep apnea syndrome 07/05/2017   ??? Peripheral artery disease (HCC) 07/05/2017    03/02/2014 LE Arterial Duplex ([facility]): mild diffuse calcified plaque with triphasic waveforms throughout both lower extremities.    ??? Restless leg syndrome    ??? Spinal stenosis    ??? Type 2 diabetes mellitus without complication, without long-term current use of insulin (HCC) 07/05/2017       Past Surgical History:   Procedure Laterality Date   ??? LUMBAR LAMINECTOMY  2014   ??? CARDIOVASCULAR STRESS TEST     ??? DOPPLER ECHOCARDIOGRAPHY     ??? ELECTROCARDIOGRAM     ??? KNEE ARTHROSCOPY Left    ??? VASECTOMY          Medications:    aspirin EC tablet 81 mg 81 mg Oral QDAY   ezetimibe (ZETIA) tablet 10 mg 10 mg Oral QDAY   fluticasone (FLOVENT HFA) 110 mcg/actuation inhaler 2 puff 2 puff Inhalation BID   heparin (porcine) BOLUS for continuous inf (bag) [phone] Units 20-40 Units/kg Intravenous As Prescribed   losartan (COZAAR) tablet 100 mg 100 mg Oral QDAY   metoprolol tartrate (LOPRESSOR) tablet 25 mg 25 mg Oral BID   pramipexole (MIRAPEX) tablet 0.5 mg 0.5 mg Oral QDAY   prednisone (DELTASONE) tablet 60 mg 60 mg Oral Q12H   rosuvastatin (CRESTOR) tablet 10 mg 10 mg Oral Once per day on Mon Thu       No Active Allergies  Family History   Problem Relation Age of Onset   ??? Diabetes Mother    ??? Hypertension Mother    ??? Coronary Artery Disease Mother    ??? Hyperlipidemia Mother    ??? Coronary Artery Disease Father      died age 39 from CHF   ??? Alzheimer's Father    ??? Heart Failure Father    ??? Heart Attack Father    ??? Hypertension Father    ??? Thyroid Disease Sister    ??? Diabetes Brother        Social History     Social History   ??? Marital status: Married     Spouse name: N/A   ??? Number of children: N/A   ??? Years of education: N/A     Social History Main Topics   ??? Smoking status: Former Smoker     Quit date: 07/05/1977   ??? Smokeless tobacco: Never Used   ??? Alcohol use No   ??? Drug use: No   ??? Sexual activity: Not on file     Other Topics Concern   ??? Not on file     Social History Narrative   ??? No narrative on file       ROS:  Constitutional: Negative for Fatigue, Weight Change, Fevers Eyes, Ears, Nose And Throat: Negative for Change in vision, Change in Hearing   Cardiovascular: POSITIVE chest pressure and SOA  Respiratory: Negative for Cough, Shortness of Breath, wheezing  Gastrointestinal: Negative for Nausea, indigestion, Diarrhea, Constipation, Rectal Bleeding   Neurological: Negative for Headache, Memory problems, Numbness, Muscle Weakness  Psychological: Negative for depression or anxiety  Musculoskeletal: Negative for Pain or Swelling in joints  Genitourinary: Negative for Pain with urination, Incontinence of urine, urinary frequency    Skin: Negative for any unusual rash  Endocrine: Negative for Any hair skin or nail changes, Unusual hunger or thirst    Physical Exam:  Temp: 36.7 ???C (98 ???F) (07/16 1200)  Pulse: 73 (07/16 1200)  BP: 118/66 (07/16 1200)      GENERAL:   A&O x 3, NAD.  HEENT  Head:  Normocephalic   Teeth: Present and in good dentition  NECK  Active ROM: full  Trachea: midline  No thyromegaly  HEART  Neck Veins- No JVD   Carotid Arteries:  No bruits  Cardiac: RRR with normal S1, S2. No murmurs  LUNGS  Auscultation- breath sounds CTA bilaterally  ABDOMEN  Soft, NT + BS  EXTREMITIES  Edema- No edema  Posterior Tibial- 2+ bil   Dorsalis Pedis- 2+ bil  No LE varicosities noted  SKIN:   Normal, without lesions  NEUROLOGIC  Grossly intact     Results for orders placed or performed during the hospital encounter of 07/10/17 (from the past 24 hour(s))   POC GLUCOSE    Collection Time: 07/11/17  2:35 PM   # # Low-High    Glucose, POC 258 (H) 70 - 100 MG/DL   POC GLUCOSE    Collection Time: 07/11/17  4:03 PM   # # Low-High    Glucose, POC 240 (H) 70 - 100 MG/DL   POC GLUCOSE    Collection Time: 07/11/17  4:57 PM   # # Low-High    Glucose, POC 224 (H) 70 - 100 MG/DL   TROPONIN-I    Collection Time: 07/11/17  5:00 PM   # # Low-High    Troponin-I 0.01 0.0 - 0.05 NG/ML   BASIC METABOLIC PANEL  Collection Time: 07/11/17  5:00 PM   # # Low-High    Sodium 136 (L) 137 - 147 MMOL/L Potassium 3.9 3.5 - 5.1 MMOL/L    Chloride 102 98 - 110 MMOL/L    CO2 22 21 - 30 MMOL/L    Anion Gap 12 3 - 12    Glucose 187 (H) 70 - 100 MG/DL    Blood Urea Nitrogen 24 7 - 25 MG/DL    Creatinine 3.76 0.4 - 1.24 MG/DL    Calcium 9.1 8.5 - 28.3 MG/DL    eGFR Non African American >60 >60 mL/min    eGFR African American >60 >60 mL/min   LACTIC ACID (BG - RAPID LACTATE)    Collection Time: 07/11/17  5:16 PM   # # Low-High    Lactic Acid,BG 4.4 (HH) 0.5 - 2.0 MMOL/L   POC GLUCOSE    Collection Time: 07/11/17  6:33 PM   # # Low-High    Glucose, POC 130 (H) 70 - 100 MG/DL   PTT (APTT)    Collection Time: 07/11/17  6:36 PM   # # Low-High    APTT 39.0 21.0 - 39.0 SEC   POC GLUCOSE    Collection Time: 07/11/17  7:05 PM   # # Low-High    Glucose, POC 119 (H) 70 - 100 MG/DL   POC GLUCOSE    Collection Time: 07/11/17  8:09 PM   # # Low-High    Glucose, POC 173 (H) 70 - 100 MG/DL   POC GLUCOSE    Collection Time: 07/11/17  9:03 PM   # # Low-High    Glucose, POC 196 (H) 70 - 100 MG/DL   TROPONIN-I    Collection Time: 07/11/17 10:18 PM   # # Low-High    Troponin-I <0.01 0.0 - 0.05 NG/ML   POC GLUCOSE    Collection Time: 07/11/17 10:19 PM   # # Low-High    Glucose, POC 208 (H) 70 - 100 MG/DL   POC GLUCOSE    Collection Time: 07/11/17 11:05 PM   # # Low-High    Glucose, POC 180 (H) 70 - 100 MG/DL   PTT (APTT)    Collection Time: 07/12/17 12:30 AM   # # Low-High    APTT 53.2 (H) 21.0 - 39.0 SEC   POC GLUCOSE    Collection Time: 07/12/17 12:30 AM   # # Low-High    Glucose, POC 115 (H) 70 - 100 MG/DL   POC GLUCOSE    Collection Time: 07/12/17 12:47 AM   # # Low-High    Glucose, POC 95 70 - 100 MG/DL   POC GLUCOSE    Collection Time: 07/12/17  1:14 AM   # # Low-High    Glucose, POC 99 70 - 100 MG/DL   POC GLUCOSE    Collection Time: 07/12/17  2:12 AM   # # Low-High    Glucose, POC 137 (H) 70 - 100 MG/DL   POC GLUCOSE    Collection Time: 07/12/17  3:04 AM   # # Low-High    Glucose, POC 133 (H) 70 - 100 MG/DL   CBC AND DIFF Collection Time: 07/12/17  3:55 AM   # # Margarito Liner    White Blood Cells 14.6 (H) 4.5 - 11.0 K/UL    RBC 3.65 (L) 4.4 - 5.5 M/UL    Hemoglobin 11.3 (L) 13.5 - 16.5 GM/DL    Hematocrit 15.1 (L) 40 - 50 %    MCV 90.9  80 - 100 FL    MCH 31.1 26 - 34 PG    MCHC 34.2 32.0 - 36.0 G/DL    RDW 16.1 11 - 15 %    Platelet Count 228 150 - 400 K/UL    MPV 8.5 7 - 11 FL    Neutrophils 70 41 - 77 %    Lymphocytes 22 (L) 24 - 44 %    Monocytes 6 4 - 12 %    Eosinophils 1 0 - 5 %    Basophils 1 0 - 2 %    Absolute Neutrophil Count 10.30 (H) 1.8 - 7.0 K/UL    Absolute Lymph Count 3.20 1.0 - 4.8 K/UL    Absolute Monocyte Count 0.90 (H) 0 - 0.80 K/UL    Absolute Eosinophil Count 0.20 0 - 0.45 K/UL    Absolute Basophil Count 0.10 0 - 0.20 K/UL   COMPREHENSIVE METABOLIC PANEL    Collection Time: 07/12/17  3:55 AM   # # Low-High    Sodium 136 (L) 137 - 147 MMOL/L    Potassium 3.3 (L) 3.5 - 5.1 MMOL/L    Chloride 106 98 - 110 MMOL/L    Glucose 142 (H) 70 - 100 MG/DL    Blood Urea Nitrogen 19 7 - 25 MG/DL    Creatinine 0.96 0.4 - 1.24 MG/DL    Calcium 7.9 (L) 8.5 - 10.6 MG/DL    Total Protein 5.4 (L) 6.0 - 8.0 G/DL    Total Bilirubin 0.4 0.3 - 1.2 MG/DL    Albumin 2.9 (L) 3.5 - 5.0 G/DL    Alk Phosphatase 39 25 - 110 U/L    AST (SGOT) 36 7 - 40 U/L    CO2 23 21 - 30 MMOL/L    ALT (SGPT) 33 7 - 56 U/L    Anion Gap 7 3 - 12    eGFR Non African American >60 >60 mL/min    eGFR African American >60 >60 mL/min   TROPONIN-I    Collection Time: 07/12/17  3:55 AM   # # Low-High    Troponin-I 0.01 0.0 - 0.05 NG/ML   MAGNESIUM    Collection Time: 07/12/17  3:55 AM   # # Low-High    Magnesium 2.1 1.6 - 2.6 mg/dL   POC GLUCOSE    Collection Time: 07/12/17  3:59 AM   # # Low-High    Glucose, POC 140 (H) 70 - 100 MG/DL   POC GLUCOSE    Collection Time: 07/12/17  5:16 AM   # # Low-High    Glucose, POC 142 (H) 70 - 100 MG/DL   POC GLUCOSE    Collection Time: 07/12/17  5:45 AM   # # Low-High    Glucose, POC 142 (H) 70 - 100 MG/DL   PTT (APTT) Collection Time: 07/12/17  6:42 AM   # # Low-High    APTT 65.9 (H) 21.0 - 39.0 SEC   POC GLUCOSE    Collection Time: 07/12/17  6:45 AM   # # Low-High    Glucose, POC 126 (H) 70 - 100 MG/DL   POC GLUCOSE    Collection Time: 07/12/17  8:07 AM   # # Low-High    Glucose, POC 101 (H) 70 - 100 MG/DL   POC GLUCOSE    Collection Time: 07/12/17  9:07 AM   # # Low-High    Glucose, POC 152 (H) 70 - 100 MG/DL   POC GLUCOSE    Collection Time: 07/12/17  10:09 AM   # # Low-High    Glucose, POC 121 (H) 70 - 100 MG/DL   POC GLUCOSE    Collection Time: 07/12/17 11:02 AM   # # Low-High    Glucose, POC 143 (H) 70 - 100 MG/DL   LACTIC ACID(LACTATE)    Collection Time: 07/12/17 11:20 AM   # # Low-High    Lactic Acid 1.7 0.5 - 2.0 MMOL/L   POC GLUCOSE    Collection Time: 07/12/17 12:17 PM   # # Low-High    Glucose, POC 145 (H) 70 - 100 MG/DL        Impression:  Active Hospital Problems    Diagnosis   ??? Unstable angina (HCC)   ??? Obesity   ??? Essential hypertension   ??? Mixed hyperlipidemia   ??? Obstructive sleep apnea syndrome     Uses CPAP.      ??? Type 2 diabetes mellitus without complication, without long-term current use of insulin (HCC)        Plan:  Dr.Daon has reviewed the films and will meet with the patient. Surgical revascularization has been recommended. We discussed procedure, risks and recovery time. Risks include but are not limited to bleeding, infection, stroke, A fib, injury to lungs, injury to kidneys, sternal dehiscence, and even death.   He agrees and wishes to proceed.   Timing will be discussed with Dr Elias Else.   PFTs and carotids have been ordered

## 2017-07-13 ENCOUNTER — Inpatient Hospital Stay: Admit: 2017-07-13 | Discharge: 2017-07-13 | Payer: BC Managed Care – PPO

## 2017-07-13 LAB — POC GLUCOSE
Lab: 101 mg/dL — ABNORMAL HIGH (ref >60)
Lab: 119 mg/dL — ABNORMAL HIGH (ref 70–100)
Lab: 121 mg/dL — ABNORMAL HIGH (ref 70–100)
Lab: 122 mg/dL — ABNORMAL HIGH (ref >60)
Lab: 131 mg/dL — ABNORMAL HIGH (ref 70–100)
Lab: 133 mg/dL — ABNORMAL HIGH (ref 70–100)
Lab: 138 mg/dL — ABNORMAL HIGH (ref 70–100)
Lab: 141 mg/dL — ABNORMAL HIGH (ref 70–100)
Lab: 144 mg/dL — ABNORMAL HIGH (ref 70–100)
Lab: 147 mg/dL — ABNORMAL HIGH (ref 70–100)
Lab: 149 mg/dL — ABNORMAL HIGH (ref 70–100)
Lab: 150 mg/dL — ABNORMAL HIGH (ref 70–100)
Lab: 150 mg/dL — ABNORMAL HIGH (ref 70–100)
Lab: 152 mg/dL — ABNORMAL HIGH (ref 70–100)
Lab: 154 mg/dL — ABNORMAL HIGH (ref 70–100)
Lab: 163 mg/dL — ABNORMAL HIGH (ref 70–100)
Lab: 190 mg/dL — ABNORMAL HIGH (ref 70–100)
Lab: 201 mg/dL — ABNORMAL HIGH (ref 70–100)
Lab: 205 mg/dL — ABNORMAL HIGH (ref 70–100)
Lab: 251 mg/dL — ABNORMAL HIGH (ref 70–100)
Lab: 268 mg/dL — ABNORMAL HIGH (ref 70–100)

## 2017-07-13 LAB — PROTIME INR (PT): Lab: 1.1 (ref 0.8–1.2)

## 2017-07-13 LAB — CBC AND DIFF: Lab: 16 K/UL — ABNORMAL HIGH (ref 4.5–11.0)

## 2017-07-13 LAB — COMPREHENSIVE METABOLIC PANEL: Lab: 136 MMOL/L — ABNORMAL LOW (ref 137–147)

## 2017-07-13 LAB — PTT (APTT)
Lab: 58 s — ABNORMAL HIGH (ref 21.0–39.0)
Lab: 139 s — ABNORMAL HIGH (ref 21.0–39.0)
Lab: 71 s — ABNORMAL HIGH (ref 21.0–39.0)

## 2017-07-13 LAB — BNP (B-TYPE NATRIURETIC PEPTI): Lab: 63 pg/mL — ABNORMAL HIGH (ref 0–100)

## 2017-07-13 LAB — MAGNESIUM: Lab: 2.6 mg/dL — ABNORMAL LOW (ref 1.6–2.6)

## 2017-07-13 MED ORDER — PRAMIPEXOLE 0.25 MG PO TAB
.5 mg | Freq: Three times a day (TID) | ORAL | 0 refills | Status: DC | PRN
Start: 2017-07-13 — End: 2017-07-14
  Administered 2017-07-13 – 2017-07-14 (×2): 0.5 mg via ORAL

## 2017-07-13 MED ORDER — ALPRAZOLAM 0.25 MG PO TAB
0.25 mg | Freq: Once | ORAL | 0 refills | Status: CP
Start: 2017-07-13 — End: ?
  Administered 2017-07-14: 01:00:00 0.25 mg via ORAL

## 2017-07-13 NOTE — Other
Critical result or procedure called (document test and value, and read back):  APTT 139.2  Time MD/NP Notified:  N/A  MD/NP Name:  N/A  MD/NP Response/Orders Given:  Heparin infusion paused, dosage will be adjusted per Heparin infusion scale protocol.

## 2017-07-13 NOTE — Progress Notes
Endocrinology Progress Note    Muhannad Dendinger  Date of Admission: 07/10/2017    Impression:Gregory Acosta is a 61 y.o. male with chest pain, HTN, HLD, PAD, RLS, diabetes type 2, spinal stenosis, OSA on CPAP, obesity, who was admitted for chest pain rule out endocrinology has been consulted for diabetes management in the setting of high PTA insulin and no insurance.  ???  Type 2 Diabetes Mellitus, uncontrolled  A1c 10.9, uncontrolled  PTA regimen: levemir 80 units BID and humalog 50 units TID w/ meals, as well as metformin and Glimepiride  Hypoglycemic episodes on this regimen: rare 3-4 times yearly, misses a lot of meal time dosing  Follows up with Dr. [name] for diabetes management  Diabetic-complications assessment:               Retinopathy: yes.                Peripheral neuropathy: yes               Autonomic neuropathy: no               Nephropathy: no.                Macrovascular complications: Yes known CAD  Risk Factor assessment               Last lipid profile 07/11/2017                            LDL 204 (goal <70)                            TG 329 (goal <150)               BP at goal (goal < 140/80)  On ACEi/ARB?: Losartan  On Statin?: intolerance  ???  Currently patient at home is on-metformin, ???glimepiride, insulin 80 units twice daily along with short-acting insulin 50 units 3 times daily with meals.  ???  Chest pain  ???  PAD  HLD  HTN: Losartan  RLS  OSA on CPAP  ???  Recommendations/Plan:  1. ???Continue Insulin gtt for CABG tomorrow for optimal glucose control.  2.  Continue Aspart 25 units with meals tid.  3. Will follow along with you with goal BS 100-140 mg/dl fasting and 454-098 during the day.    Steroid: prednisone 60 mg on 07/12/17 at 9 am and 8 pm and Solumedrol 125 mg daily  __________________________________________________________________________________  Subjective:  Marino Hargreaves is a 61 y.o. male. No acute events overnight. No hypoglycemic episodes. Remains on insulin gtt but did not get CABG today, plan for tomorrow AM.    On JXB:JYNWGN chest pain, SOB, N&V      Objective:                       Vital Signs: Last Filed                 Vital Signs: 24 Hour Range   BP: 121/67 (07/17 1200)  Temp: 36.5 ???C (97.7 ???F) (07/17 1200)  Pulse: 71 (07/17 1200)  Respirations: 18 PER MINUTE (07/17 1200)  SpO2: 99 % (07/17 1200)  O2 Delivery: None (Room Air) (07/17 1200)  SpO2 Pulse: 69 (07/17 1200)  Height: 182.9 cm (72.01) (07/17 0600) BP: (102-146)/(45-92)   Temp:  [36.5 ???C (97.7 ???F)-36.9 ???C (98.5 ???F)]   Pulse:  [70-91]  Respirations:  [11 PER MINUTE-23 PER MINUTE]   SpO2:  [91 %-99 %]   O2 Delivery: None (Room Air)      Intake/Output Summary (Last 24 hours) at 07/13/17 1356  Last data filed at 07/13/17 1339   Gross per 24 hour   Intake          3353.49 ml   Output             4275 ml   Net          -921.51 ml         Physical Examination:  GEN: Alert, oriented, no apparent distress  HEENT: No scleral icterus/pallor  RESP: breathing comfortably  EXT: No edema      Labs:  Recent Labs      07/13/17   0404  07/13/17   0600  07/13/17   0806  07/13/17   0903  07/13/17   1010  07/13/17   1106  07/13/17   1220  07/13/17   1339   GLUPOC  144*  133*  268*  163*  154*  150*  131*  138*       Recent Labs      07/10/17   1600  07/10/17   2155  07/11/17   0320  07/11/17   0837  07/11/17   1700  07/12/17   0355  07/13/17   0320   NA  135*   --   130*  133*  136*  136*  136*   K  3.8   --   5.7*  4.7  3.9  3.3*  4.4   CL  97*   --   98  99  102  106  106   CO2  24   --   18*  23  22  23   20*   GAP  14*   --   14*  11  12  7  10    BUN  25   --   25  24  24  19  14    CR  1.32*   --   1.04  1.03  0.99  0.74  0.96   GLU  143*   --   395*  373*  187*  142*  127*   CA  9.8   --   9.4  9.2  9.1  7.9*  9.1   ALBUMIN  4.3   --   4.2   --    --   2.9*  3.8   MG  2.0   --   2.4   --    --   2.1  2.6   HGBA1C   --   10.9*   --    --    --    --    -- TSH   --   2.400   --    --    --    --    --        Recent Labs      07/10/17   1600  07/10/17   2155  07/11/17   0320  07/11/17   1032  07/11/17   1130  07/11/17   1700  07/11/17   1836  07/11/17   2218  07/12/17   0030  07/12/17   0355  07/12/17   1610  07/12/17   1515  07/12/17   2120  07/13/17   0320  07/13/17   1000  07/13/17   1220   WBC  14.6*   --   12.8*   --    --    --    --    --    --   14.6*   --    --    --   16.7*   --    --    HGB  14.3   --   14.3   --    --    --    --    --    --   11.3*   --    --    --   12.7*   --    --    HCT  41.7   --   42.2   --    --    --    --    --    --   33.2*   --    --    --   37.8*   --    --    PLTCT  290   --   304   --    --    --    --    --    --   228   --    --    --   266   --    --    INR  1.0   --    --    --    --    --    --    --    --    --    --    --    --    --   1.1   --    PTT   --    --   33.4  36.6   --    --   39.0   --   53.2*   --   65.9*  86.5*  58.7*  139.2*   --   71.7*   AST  53*   --   54*   --    --    --    --    --    --   36   --    --    --   31   --    --    ALT  44   --   45   --    --    --    --    --    --   33   --    --    --   39   --    --    ALKPHOS  74   --   69   --    --    --    --    --    --   39   --    --    --   52   --    --    TNI   --   0.01  0.01   --   0.01  0.01   --   <0.01   --   0.01   --    --    --    --    --    --      Lipid Profile: Lab Results   Component Value Date  CHOL 282 07/11/2017    TRIG 329 07/11/2017    HDL 39 07/11/2017    LDL 204 07/11/2017    VLDL 66 07/11/2017      Estimated Creatinine Clearance: 116.6 mL/min (based on SCr of 0.96 mg/dL).  Vitals:    07/10/17 2327 07/12/17 0822 07/13/17 0600   Weight: 134.1 kg (295 lb 9.6 oz) (!) 137.9 kg (304 lb) (!) 138.6 kg (305 lb 8.9 oz)    No results for input(s): PHART, PO2ART in the last 72 hours.    Invalid input(s): PC02A      Thyroid Studies    Lab Results   Component Value Date/Time    TSH 2.400 07/10/2017 09:55 PM No results found for: Edsel Petrin, THYBINDGLB                    Meds  aspirin EC 81 mg Oral QDAY   ezetimibe 10 mg Oral QDAY   fluticasone 2 puff Inhalation BID   heparin (porcine) 20-40 Units/kg Intravenous As Prescribed   insulin aspart U-100 25 Units Subcutaneous TID w/ meals   losartan 100 mg Oral QDAY   metoprolol tartrate 25 mg Oral BID   rosuvastatin 10 mg Oral Once per day on Mon Thu    IV MEDS  ??? heparin (porcine) 20,000 units/D5W 500 mL infusion (std conc)(premade) 1,800 Units/hr (07/13/17 1342)   ??? insulin regular (NOVOLIN R) 100 Units in sodium chloride 0.9% (NS) 100 mL IV drip (std conc) 6.5 Units/hr (07/13/17 1342)   ??? nitroGLYCERIN 50 mg/D5W 250 mL infusion 0.5 mcg/kg/min (07/13/17 1200)     Prnacetaminophen Q4H PRN, albuterol Q4H PRN, aluminum/magnesium hydroxide Q4H PRN, diphenhydrAMINE Q4H PRN **OR** diphenhydrAMINE Q4H PRN, lidocaine PF PRN, ondansetron (ZOFRAN) IV Q6H PRN, pramipexole TID PRN     Konrad Felix, MD  Pager # [phone]  07/13/2017

## 2017-07-13 NOTE — Progress Notes
Mr. Arco is a 61 y/o male with a h/o uncontrolled DM, OSA, PAD, hyperlipidemia and spinal stenosis who presented to the ED on 07/10/17 with chest heaviness and pressure.  Cath shows three vessel CAD. CTS consulted for CABG.    Currently on heparin, nitro, and insulin gtts    VS: afebrile, HR 70-80s, on RA, BP 110-130s    Labs:  Hgb 12.7, plt 266, WBC 16.7 (received prednisone on 7/16)  Cr 0.96  BNP 63  UA normal  Blood sugars 120-260    Carotid duplex:  1. Mild atheromatous plaque is seen in bilateral common and internal carotid arteries.  2. No hemodynamically significant (>50%) stenosis is seen in common and internal carotid arteries bilaterally  3. There is normal antegrade flow in both vertebral arteries  4. No evidence of proximal subclavian stenosis    PFTs pending    Plan:  CABG tomorrow with Dr. Elias Else. Consent signed.  T&C pending  PFTs pending  NPO at midnight

## 2017-07-13 NOTE — Progress Notes
RESPIRATORY THERAPY  ADULT PROTOCOL EVALUATION      RESPIRATORY PROTOCOL PLAN    Medications  Albuterol: MDI PRN    Note: If indicated by protocol, medication orders will be placed by therapist.    Procedures  PAP: Place a nursing order for "IS Q1h While Awake" for any of Lung Expansion indicators       _____________________________________________________________    PATIENT EVALUATION RESULTS    Chart Review  * Pulmonary Hx: Hx pulmonary disease, hx reactive or obstructive airway disease (PEFR & AM) OR regular home use of bronchodilators (AM) OR inhaled or systemic steroid use for lungs < or equal to 4 times/yr (AM)    * Surgical Hx: General surgery (cough & sigh not affected)    * Chest X-Ray: Infiltrates (AC) OR atelectasis (LE) OR pleural effusion OR rib fractures (LE)    * PFT/Oxygenation: FEV1, PEFR > 80% predicted OR physically unable to perform OR Pa02 >80 RA OR Sp02 >95% RA      Patient Assessment  * Respiratory Pattern: Regular pattern and rate OR good chest excursion with deep breathing    * Breath Sounds: Clear apically, but diminished in bases (LE) OR CHF related crackles (02) (oximetry)    * Cough / Sputum: Strong, effective cough OR nonproductive    * Mental Status: Alert, oriented, cooperative    * Activity Level: Ambulatory with assistance      Priority Index  Total Points: 8 Points  * Priority Index: 1+    PRIORITY INDEX GUIDELINES*  Priority Points   1 0-9 points   2 9-18 points   3 > 18 points   + Pulm Dx or Home Rx   *Higher points indicate higher acuity.      Therapist: Deloris PingAshley Howell, RT  Date: 07/13/2017      Key  AC = Airway clearance  AM = Aerosolized medication  BA = Bland aerosol  DB&C = Deep breathe & cough  FEV1 = Forced expiratory volume in first second)  IC = Inspiratory capacity  LE = Lung expansion  MDI = Metered dose inhaler  Neb = Nebulizer  O2 = Oxygen  Oxim =Oximetry  PEFR = Peak expiratory flow rate  RRT = Rapid Response Team

## 2017-07-13 NOTE — Progress Notes
Critical Care Progress Note          Today's Date:  07/13/2017  Name:  Clearance Chenault                       MRN:  [id number]   Admission Date: 07/10/2017  LOS: 3 days                     Assessment/Plan:   Principal Problem:    Unstable angina (HCC)  Active Problems:    Type 2 diabetes mellitus without complication, without long-term current use of insulin (HCC)    Essential hypertension    Mixed hyperlipidemia    Obstructive sleep apnea syndrome    Obesity    Coronary artery disease involving native coronary artery of native heart        61 yo M who presented with worsening chest pain/pressure concerning for unstable angina. Troponin negative, no history of prior CAD but significant risk factors and family history of CAD.    Neuro: Nitroglycerin relieved chest pain, remains on low dose nitroglycerin infusion.   Cardiac: Unstable angina with improved chest pain on heparin and nitroglycerin. Wean nitro as able. LHC with 3v disease, CABG tomorrow. Asa/BB/heparin gtt. Statin/zetia for HLD. Losartan for hypertension. TTE with EF 50%.  Pulmonary: Recently evaluated for worsening dyspnea on exertion and no evidence of obstructive lung disease. PFTs with reduced FEV1 but reasonable FEF25-75. OSA, using home CPAP. IS and pulm hygiene here, continue flovent.  FEN: ADAT on cardiac/diabetic diet. Denies nausea. NPO after midnight.  ID:  No evidence of active infection. Leukocytosis likely secondary to steroids (s/p solumedrol and prednisone this admit).  Renal:  Normal kidney function, anion gap has closed and metabolic acidosis has resolved after glycemic control and fluid resucitation, lactate has normalized. hypokalemia resolved, keep >4 and Mg >2. Monitor UOP.  Heme:  On heparin drip, continue. T&S active.  Endo:  Uncontrolled diabetes, concern for DKA has resolved. Endo on consult given insulin resistance at home and elevated A1c. Planning to remain on insulin gtt + meal time dose until surgery. Prophylaxis: HOB>40, PPI not indicated, DVT prophylaxis with heparin infusion  Disposition/Family: Likely stable for telemetry status.  __________________________________________________________________________________  Subjective:  Naitik Hermann is a 61 y.o. male.  NAEON  Objective:  Medications:  Scheduled Meds:    aspirin EC tablet 81 mg 81 mg Oral QDAY   ezetimibe (ZETIA) tablet 10 mg 10 mg Oral QDAY   fluticasone (FLOVENT HFA) 110 mcg/actuation inhaler 2 puff 2 puff Inhalation BID   heparin (porcine) BOLUS for continuous inf (bag) [phone] Units 20-40 Units/kg Intravenous As Prescribed   insulin aspart U-100 (NOVOLOG FLEXPEN) injection PEN 25 Units 25 Units Subcutaneous TID w/ meals   losartan (COZAAR) tablet 100 mg 100 mg Oral QDAY   metoprolol tartrate (LOPRESSOR) tablet 25 mg 25 mg Oral BID   rosuvastatin (CRESTOR) tablet 10 mg 10 mg Oral Once per day on Mon Thu   SODIUM CHLORIDE 0.9 % IV SOLP (Cabinet Override)   NOW   Continuous Infusions:  ??? heparin (porcine) 20,000 units/D5W 500 mL infusion (std conc)(premade) 1,800 Units/hr (07/13/17 1342)   ??? insulin regular (NOVOLIN R) 100 Units in sodium chloride 0.9% (NS) 100 mL IV drip (std conc) 8 Units/hr (07/13/17 1508)   ??? nitroGLYCERIN 50 mg/D5W 250 mL infusion 0.5 mcg/kg/min (07/13/17 1200)     PRN and Respiratory Meds:acetaminophen Q4H PRN, albuterol Q4H PRN, aluminum/magnesium hydroxide Q4H PRN, diphenhydrAMINE Q4H PRN **  OR** diphenhydrAMINE Q4H PRN, lidocaine PF PRN, ondansetron (ZOFRAN) IV Q6H PRN, pramipexole TID PRN                     Vital Signs: Last Filed                  Vital Signs: 24 Hour Range   BP: 121/67 (07/17 1200)  Temp: 36.5 ???C (97.7 ???F) (07/17 1200)  Pulse: 71 (07/17 1200)  Respirations: 18 PER MINUTE (07/17 1200)  SpO2: 99 % (07/17 1200)  O2 Delivery: None (Room Air) (07/17 1200)  Height: 182.9 cm (72.01) (07/17 0600)  Weight: 138.6 kg (305 lb 8.9 oz) (07/17 0600)  BP: (102-146)/(45-92) Temp:  [36.5 ???C (97.7 ???F)-36.9 ???C (98.5 ???F)]   Pulse:  [70-90]   Respirations:  [16 PER MINUTE-20 PER MINUTE]   SpO2:  [91 %-99 %]   O2 Delivery: None (Room Air)    Intensity Pain Scale 0-10 (Pain 1): (not recorded) Vitals:    07/10/17 2327 07/12/17 0822 07/13/17 0600   Weight: 134.1 kg (295 lb 9.6 oz) (!) 137.9 kg (304 lb) (!) 138.6 kg (305 lb 8.9 oz)       Critical Care Vitals:      ICP Monitoring:     PA  Catheter:     Hemodynamics/Oxycalcs:       Intake/Output Summary:  (Last 24 hours)    Intake/Output Summary (Last 24 hours) at 07/13/17 1608  Last data filed at 07/13/17 1500   Gross per 24 hour   Intake          2630.07 ml   Output             4275 ml   Net         -1644.93 ml         Physical Exam:  General:  no distress, appears stated age  Lungs:  Clear to auscultation bilaterally  CV: RRR  Abdomen:  Soft, non-tender, obese.  Bowel sounds normal.  No masses.  No organomegaly.  Extremities:  Extremities normal, atraumatic, no cyanosis or edema  Neurologic:  CNII - XII intact.  PERRL      Prophylaxis Review:  Lines:  No  Urinary Catheter:  No  Antibiotic Usage:  No  VTE: heparin infusion    Lab Review:  Pertinent labs reviewed  Point of Care Testing:  (Last 24 hours):  Glucose: (!) 127 (07/13/17 0320)  POC Glucose (Download): (!) 190 (07/13/17 1505)    Radiology and Other Diagnostic Procedures Review:    Pertinent radiology reviewed.    I have seen, examined and reviewed data concerning this patient.  I discussed the findings and plan of care with the ICU team. I spent 35 minutes in subsequent care time, excluding procedures today.    Bard Herbert, MD  Anesthesiology and Critical Care  [phone]

## 2017-07-13 NOTE — Case Management (ED)
Case Management Progress Note    NAME:Gregory Acosta                          MRN: [id number]              DOB:[birth year]          AGE: 61 y.o.  ADMISSION DATE: 07/10/2017             DAYS ADMITTED: LOS: 3 days      Todays Date: 07/13/2017    Plan  Plan for CABG and transfer to CTS service tomorrow    Interventions  ? Support      ? Info or Referral      ? Discharge Planning    Pt discussed in huddle with team, plan for CABG tomorrow     ? Medication Needs      ? Financial      ? Legal   Legal: DPOA & Advance Directives    SW placed DPOA on pt's chart and provided pt with copies. Pt and pt's wife deny further questions or CM needs at this time.     ? Other        Disposition  ? Expected Discharge Date    Expected Discharge Date: 07/19/17  ? Transportation   Does the patient need discharge transport arranged?: No  Transportation Name, Phone and Availability #1: Raynelle FanningJulie (wife) [phone]  Does the patient use Medicaid Transportation?: No  ? Next Level of Care (Acute Psych discharges only)      ? Discharge Disposition                                          Durable Medical Equipment     No service has been selected for the patient.      [facility]     No service has been selected for the patient.      [facility]     No service has been selected for the patient.      [facility]     No service has been selected for the patient.          Redgie Grayerhristina Chaka Boyson, LMSW  p. [phone]

## 2017-07-14 ENCOUNTER — Encounter: Admit: 2017-07-14 | Discharge: 2017-07-14 | Payer: BC Managed Care – PPO

## 2017-07-14 DIAGNOSIS — M4800 Spinal stenosis, site unspecified: ICD-10-CM

## 2017-07-14 DIAGNOSIS — E782 Mixed hyperlipidemia: ICD-10-CM

## 2017-07-14 DIAGNOSIS — G4733 Obstructive sleep apnea (adult) (pediatric): ICD-10-CM

## 2017-07-14 DIAGNOSIS — I517 Cardiomegaly: ICD-10-CM

## 2017-07-14 DIAGNOSIS — E119 Type 2 diabetes mellitus without complications: Principal | ICD-10-CM

## 2017-07-14 DIAGNOSIS — R079 Chest pain, unspecified: ICD-10-CM

## 2017-07-14 DIAGNOSIS — G2581 Restless legs syndrome: ICD-10-CM

## 2017-07-14 DIAGNOSIS — R0789 Other chest pain: Secondary | ICD-10-CM

## 2017-07-14 DIAGNOSIS — I739 Peripheral vascular disease, unspecified: ICD-10-CM

## 2017-07-14 DIAGNOSIS — Z8249 Family history of ischemic heart disease and other diseases of the circulatory system: ICD-10-CM

## 2017-07-14 DIAGNOSIS — I10 Essential (primary) hypertension: ICD-10-CM

## 2017-07-14 LAB — BASIC METABOLIC PANEL
Lab: 0.9 mg/dL (ref 0.4–1.24)
Lab: 130 mg/dL — ABNORMAL HIGH (ref 70–100)
Lab: 136 MMOL/L — ABNORMAL LOW (ref 137–147)
Lab: 14 mg/dL (ref 7–25)
Lab: 4.5 MMOL/L — ABNORMAL LOW (ref 3.5–5.1)
Lab: 8.2 mg/dL — ABNORMAL LOW (ref 8.5–10.6)
Lab: >60 mL/min (ref >60)
Lab: >60 mL/min (ref >60)

## 2017-07-14 LAB — POC GLUCOSE
Lab: 109 mg/dL — ABNORMAL HIGH (ref 70–100)
Lab: 123 mg/dL — ABNORMAL HIGH (ref 70–100)
Lab: 129 mg/dL — ABNORMAL HIGH (ref 70–100)
Lab: 129 mg/dL — ABNORMAL HIGH (ref 70–100)
Lab: 131 mg/dL — ABNORMAL HIGH (ref 70–100)
Lab: 132 mg/dL — ABNORMAL HIGH (ref 70–100)
Lab: 135 mg/dL — ABNORMAL HIGH (ref 70–100)
Lab: 138 mg/dL — ABNORMAL HIGH (ref 70–100)
Lab: 141 mg/dL — ABNORMAL HIGH (ref 70–100)
Lab: 142 mg/dL — ABNORMAL HIGH (ref 70–100)
Lab: 145 mg/dL — ABNORMAL HIGH (ref 70–100)
Lab: 149 mg/dL — ABNORMAL HIGH (ref 70–100)
Lab: 149 mg/dL — ABNORMAL HIGH (ref 70–100)
Lab: 151 mg/dL — ABNORMAL HIGH (ref 70–100)
Lab: 157 mg/dL — ABNORMAL HIGH (ref 70–100)
Lab: 158 mg/dL — ABNORMAL HIGH (ref 70–100)
Lab: 160 mg/dL — ABNORMAL HIGH (ref 70–100)
Lab: 192 mg/dL — ABNORMAL HIGH (ref 70–100)
Lab: 217 mg/dL — ABNORMAL HIGH (ref 70–100)
Lab: 226 mg/dL — ABNORMAL HIGH (ref 70–100)
Lab: 239 mg/dL — ABNORMAL HIGH (ref 70–100)
Lab: 250 mg/dL — ABNORMAL HIGH (ref 70–100)
Lab: 252 mg/dL — ABNORMAL HIGH (ref 70–100)
Lab: 82 mg/dL (ref 70–100)
Lab: 89 mg/dL (ref 70–100)

## 2017-07-14 LAB — POC BLOOD GAS ARTERIAL
Lab: 142 mmHg — ABNORMAL HIGH (ref 80–100)
Lab: 2 MMOL/L
Lab: 23 MMOL/L (ref 21–28)
Lab: 44 mmHg (ref 35–45)
Lab: 99 % (ref 95–99)
Lab: 100 % — ABNORMAL HIGH (ref 95–99)
Lab: 154 mmHg — ABNORMAL HIGH (ref 80–100)
Lab: 2 MMOL/L
Lab: 24 MMOL/L (ref 21–28)
Lab: 29 MMOL/L — ABNORMAL HIGH (ref 21–28)
Lab: 49 mmHg — ABNORMAL HIGH (ref 35–45)
Lab: 50 mmHg — ABNORMAL HIGH (ref 35–45)
Lab: 7.3 (ref 7.35–7.45)
Lab: 99 % (ref 95–99)
Lab: 100 % — ABNORMAL HIGH (ref 95–99)
Lab: 28 MMOL/L — ABNORMAL HIGH (ref 21–28)
Lab: 377 mmHg — ABNORMAL HIGH (ref 80–100)
Lab: 4 MMOL/L
Lab: 41 mmHg (ref 35–45)
Lab: 7.4 (ref 7.35–7.45)
Lab: 1 MMOL/L
Lab: 100 % — ABNORMAL HIGH (ref 95–99)
Lab: 23 MMOL/L (ref 21–28)
Lab: 27 MMOL/L (ref 21–28)
Lab: 3 MMOL/L
Lab: 36 mmHg (ref 35–45)
Lab: 38 mmHg (ref 35–45)
Lab: 389 mmHg — ABNORMAL HIGH (ref 80–100)
Lab: 7.4 (ref 7.35–7.45)
Lab: 7.4 — ABNORMAL HIGH (ref 7.35–7.45)
Lab: 97 mmHg (ref 80–100)
Lab: 98 % (ref 95–99)
Lab: 7.3 — ABNORMAL LOW (ref 7.35–7.45)
Lab: 7.3 — ABNORMAL LOW (ref 7.35–7.45)
Lab: 1 MMOL/L
Lab: 100 % — ABNORMAL HIGH (ref 95–99)
Lab: 172 mmHg — ABNORMAL HIGH (ref 80–100)
Lab: 24 MMOL/L (ref 21–28)
Lab: 39 mmHg (ref 35–45)
Lab: 7.4 (ref 7.35–7.45)
Lab: 35 mmHg (ref 35–45)
Lab: 34 mmHg — ABNORMAL LOW (ref 35–45)
Lab: 7.4 (ref 7.35–7.45)
Lab: 20 MMOL/L — ABNORMAL LOW (ref 21–28)
Lab: 33 mmHg — ABNORMAL LOW (ref 35–45)
Lab: 4 MMOL/L
Lab: 7.4 % — ABNORMAL HIGH (ref 7.35–7.45)
Lab: 7.4 (ref 7.35–7.45)
Lab: 20 MMOL/L — ABNORMAL LOW (ref 21–28)
Lab: 3 MMOL/L
Lab: 31 mmHg — ABNORMAL LOW (ref 35–45)
Lab: 7.4 (ref 7.35–7.45)
Lab: 96 % (ref 95–99)

## 2017-07-14 LAB — BLOOD GASES, ARTERIAL
Lab: 3 MMOL/L
Lab: 33 mmHg — ABNORMAL LOW (ref 35–45)
Lab: 7.4 (ref 7.35–7.45)
Lab: 79 mmHg — ABNORMAL LOW (ref 80–100)
Lab: 95 % (ref 95–99)

## 2017-07-14 LAB — POC HEMATOCRIT
Lab: 11 g/dL — ABNORMAL LOW (ref 13.5–16.5)
Lab: 34 % — ABNORMAL LOW (ref 40–50)
Lab: 10 g/dL — ABNORMAL LOW (ref 13.5–16.5)
Lab: 27 % — ABNORMAL LOW (ref 40–50)
Lab: 32 % — ABNORMAL LOW (ref 40–50)
Lab: 9.2 g/dL — ABNORMAL LOW (ref 13.5–16.5)
Lab: 27 % — ABNORMAL LOW (ref 40–50)
Lab: 9.2 g/dL — ABNORMAL LOW (ref 13.5–16.5)
Lab: 27 % — ABNORMAL LOW (ref 40–50)
Lab: 28 % — ABNORMAL LOW (ref 40–50)
Lab: 9.2 g/dL — ABNORMAL LOW (ref 13.5–16.5)
Lab: 9.5 g/dL — ABNORMAL LOW (ref 13.5–16.5)
Lab: 10 g/dL — ABNORMAL LOW (ref 13.5–16.5)
Lab: 31 % — ABNORMAL LOW (ref 40–50)

## 2017-07-14 LAB — POC IONIZED CALCIUM
Lab: 1.1 MMOL/L (ref 1.0–1.3)
Lab: 1 MMOL/L (ref 1.0–1.3)
Lab: 1.1 MMOL/L — ABNORMAL LOW (ref 1.0–1.3)
Lab: 1 MMOL/L (ref 1.0–1.3)
Lab: 1 MMOL/L (ref 1.0–1.3)
Lab: 1.1 MMOL/L (ref 1.0–1.3)
Lab: 1.1 MMOL/L (ref 1.0–1.3)

## 2017-07-14 LAB — POC POTASSIUM
Lab: 4.1 MMOL/L (ref 3.5–5.1)
Lab: 4.1 MMOL/L (ref 3.5–5.1)
Lab: 4.9 MMOL/L (ref 3.5–5.1)
Lab: 4.8 MMOL/L (ref 3.5–5.1)
Lab: 4.2 MMOL/L (ref 3.5–5.1)
Lab: 4.6 MMOL/L (ref 3.5–5.1)
Lab: 4.5 MMOL/L (ref 3.5–5.1)

## 2017-07-14 LAB — PROTIME INR (PT): Lab: 1.2 MMOL/L — ABNORMAL LOW (ref 0.8–1.2)

## 2017-07-14 LAB — PTT (APTT)
Lab: 80 s — ABNORMAL HIGH (ref 21.0–39.0)
Lab: 83 s — ABNORMAL HIGH (ref 21.0–39.0)
Lab: 28 s (ref 21.0–39.0)

## 2017-07-14 LAB — POC SODIUM
Lab: 139 MMOL/L (ref 137–147)
Lab: 138 MMOL/L (ref 137–147)
Lab: 139 MMOL/L (ref 137–147)
Lab: 137 MMOL/L (ref 137–147)
Lab: 137 MMOL/L (ref 137–147)
Lab: 139 MMOL/L (ref 137–147)
Lab: 139 MMOL/L (ref 137–147)

## 2017-07-14 LAB — COMPREHENSIVE METABOLIC PANEL: Lab: 136 MMOL/L — ABNORMAL LOW (ref 137–147)

## 2017-07-14 LAB — O2 SATURATION, MIXED VENOUS: Lab: 57 % (ref 7–25)

## 2017-07-14 LAB — CBC AND DIFF: Lab: 15 K/UL — ABNORMAL HIGH (ref 4.5–11.0)

## 2017-07-14 LAB — MAGNESIUM
Lab: 2.2 mg/dL — ABNORMAL LOW (ref 1.6–2.6)
Lab: 2.8 mg/dL — ABNORMAL HIGH (ref 1.6–2.6)

## 2017-07-14 LAB — CBC: Lab: 22 10*3/uL — ABNORMAL HIGH (ref 4.5–11.0)

## 2017-07-14 MED ORDER — POTASSIUM CHLORIDE 20 MEQ PO TBTQ
20-40 meq | ORAL | 0 refills | Status: DC | PRN
Start: 2017-07-14 — End: 2017-07-20
  Administered 2017-07-18: 11:00:00 40 meq via ORAL
  Administered 2017-07-20: 14:00:00 20 meq via ORAL

## 2017-07-14 MED ORDER — NITROGLYCERIN IN 5 % DEXTROSE 50 MG/250 ML (200 MCG/ML) IV SOLN
.1-3 ug/kg/min | INTRAVENOUS | 0 refills | Status: DC
Start: 2017-07-14 — End: 2017-07-15

## 2017-07-14 MED ORDER — AMINOCAPROIC ACID INFUSION
2 g/h | INTRAVENOUS | 0 refills | Status: AC
Start: 2017-07-14 — End: ?

## 2017-07-14 MED ORDER — MIDAZOLAM 1 MG/ML IJ SOLN
INTRAVENOUS | 0 refills | Status: DC
Start: 2017-07-14 — End: 2017-07-14
  Administered 2017-07-14: 14:00:00 1 mg via INTRAVENOUS
  Administered 2017-07-14: 13:00:00 2 mg via INTRAVENOUS
  Administered 2017-07-14: 15:00:00 7 mg via INTRAVENOUS

## 2017-07-14 MED ORDER — MAGNESIUM HYDROXIDE 2,400 MG/10 ML PO SUSP
10 mL | Freq: Every day | ORAL | 0 refills | Status: DC | PRN
Start: 2017-07-14 — End: 2017-07-20
  Administered 2017-07-16 – 2017-07-18 (×2): 10 mL via ORAL

## 2017-07-14 MED ORDER — ASPIRIN 81 MG PO CHEW
81 mg | Freq: Every day | ORAL | 0 refills | Status: DC
Start: 2017-07-14 — End: 2017-07-20
  Administered 2017-07-15 – 2017-07-19 (×6): 81 mg via ORAL

## 2017-07-14 MED ORDER — OXYCODONE 5 MG PO TAB
5-10 mg | ORAL | 0 refills | Status: DC | PRN
Start: 2017-07-14 — End: 2017-07-15
  Administered 2017-07-14 – 2017-07-15 (×4): 10 mg via ORAL

## 2017-07-14 MED ORDER — NITROGLYCERIN IN 5 % DEXTROSE 50 MG/250 ML (200 MCG/ML) IV SOLN (INFUSION)(AM)(OR)
INTRAVENOUS | 0 refills | Status: DC
Start: 2017-07-14 — End: 2017-07-14
  Administered 2017-07-14: 15:00:00 200 ug via INTRAVENOUS
  Administered 2017-07-14: 15:00:00 1 ug/kg/min via INTRAVENOUS

## 2017-07-14 MED ORDER — EZETIMIBE 10 MG PO TAB
10 mg | Freq: Every day | ORAL | 0 refills | Status: DC
Start: 2017-07-14 — End: 2017-07-20
  Administered 2017-07-15 – 2017-07-20 (×6): 10 mg via ORAL

## 2017-07-14 MED ORDER — HYDRALAZINE 20 MG/ML IJ SOLN
0 refills | Status: DC
Start: 2017-07-14 — End: 2017-07-14
  Administered 2017-07-14 (×2): 5 mg via INTRAVENOUS

## 2017-07-14 MED ORDER — AMINOCAPROIC ACID 250 MG/ML IV SOLN
INTRAVENOUS | 0 refills | Status: DC
Start: 2017-07-14 — End: 2017-07-14
  Administered 2017-07-14: 13:00:00 5 g via INTRAVENOUS

## 2017-07-14 MED ORDER — PROTAMINE 10 MG/ML IV SOLN
0 refills | Status: DC
Start: 2017-07-14 — End: 2017-07-14
  Administered 2017-07-14 (×2): 20 mg via INTRAVENOUS
  Administered 2017-07-14: 17:00:00 30 mg via INTRAVENOUS
  Administered 2017-07-14 (×2): 20 mg via INTRAVENOUS
  Administered 2017-07-14: 17:00:00 30 mg via INTRAVENOUS
  Administered 2017-07-14: 17:00:00 20 mg via INTRAVENOUS
  Administered 2017-07-14: 17:00:00 30 mg via INTRAVENOUS
  Administered 2017-07-14: 17:00:00 20 mg via INTRAVENOUS
  Administered 2017-07-14: 17:00:00 15 mg via INTRAVENOUS
  Administered 2017-07-14: 17:00:00 60 mg via INTRAVENOUS
  Administered 2017-07-14: 17:00:00 30 mg via INTRAVENOUS

## 2017-07-14 MED ORDER — INSULIN 100UNITS NS 100ML
1-32 [IU]/h | INTRAVENOUS | 0 refills | Status: DC
Start: 2017-07-14 — End: 2017-07-16
  Administered 2017-07-15 (×2): 4 [IU]/h via INTRAVENOUS

## 2017-07-14 MED ORDER — HEPARIN (PORCINE) 1,000 UNIT/ML IJ SOLN
INTRAVENOUS | 0 refills | Status: DC
Start: 2017-07-14 — End: 2017-07-14
  Administered 2017-07-14: 14:00:00 5000 [IU] via INTRAVENOUS
  Administered 2017-07-14: 15:00:00 40000 [IU] via INTRAVENOUS

## 2017-07-14 MED ORDER — ASPIRIN 325 MG PO TAB
325 mg | Freq: Every day | ORAL | 0 refills | Status: DC
Start: 2017-07-14 — End: 2017-07-14

## 2017-07-14 MED ORDER — FAMOTIDINE 20 MG PO TAB
20 mg | Freq: Two times a day (BID) | ORAL | 0 refills | Status: DC
Start: 2017-07-14 — End: 2017-07-15

## 2017-07-14 MED ORDER — ALBUMIN, HUMAN 5 % IV SOLP
500 mL | Freq: Once | INTRAVENOUS | 0 refills | Status: CP
Start: 2017-07-14 — End: ?
  Administered 2017-07-14: 19:00:00 500 mL via INTRAVENOUS

## 2017-07-14 MED ORDER — DOBUTAMINE IN D5W 1,000 MG/250 ML (4,000 MCG/ML) IV SOLP
1-20 ug/kg/min | INTRAVENOUS | 0 refills | Status: DC
Start: 2017-07-14 — End: 2017-07-15

## 2017-07-14 MED ORDER — HEPARIN 20000UNITS PLASMALYTE-A 1000ML IRR(OR)
0 refills | Status: DC
Start: 2017-07-14 — End: 2017-07-14
  Administered 2017-07-14 (×2): 500 mL

## 2017-07-14 MED ORDER — VANCOMYCIN 1,000 MG IV SOLR
0 refills | Status: DC
Start: 2017-07-14 — End: 2017-07-14
  Administered 2017-07-14: 14:00:00 4 g via TOPICAL

## 2017-07-14 MED ORDER — SODIUM CHLORIDE 0.9 % IV SOLP
0 refills | Status: DC
Start: 2017-07-14 — End: 2017-07-14
  Administered 2017-07-14: 13:00:00 via INTRAVENOUS

## 2017-07-14 MED ORDER — LIDOCAINE (PF) 100 MG/5 ML (2 %) IV SYRG
1 mg/kg | INTRAVENOUS | 0 refills | Status: DC | PRN
Start: 2017-07-14 — End: 2017-07-20

## 2017-07-14 MED ORDER — DEXMEDETOMIDINE IV DRIP (STD CONC)
0.2-1 ug/kg/h | INTRAVENOUS | 0 refills | Status: DC
Start: 2017-07-14 — End: 2017-07-15
  Administered 2017-07-14 (×2): 0.6 ug/kg/h via INTRAVENOUS

## 2017-07-14 MED ORDER — FAMOTIDINE (PF) 20 MG/2 ML IV SOLN
20 mg | Freq: Two times a day (BID) | INTRAVENOUS | 0 refills | Status: DC
Start: 2017-07-14 — End: 2017-07-15
  Administered 2017-07-15: 01:00:00 20 mg via INTRAVENOUS

## 2017-07-14 MED ORDER — INSULIN 100UNITS NS 100ML
INTRAVENOUS | 0 refills | Status: DC
Start: 2017-07-14 — End: 2017-07-14
  Administered 2017-07-14 (×2): 8 [IU]/h via INTRAVENOUS

## 2017-07-14 MED ORDER — AMINOCAPROIC ACID 12.5 GM INFUSION (OR)
INTRAVENOUS | 0 refills | Status: DC
Start: 2017-07-14 — End: 2017-07-14
  Administered 2017-07-14 (×2): 40 mL/h via INTRAVENOUS

## 2017-07-14 MED ORDER — MAGNESIUM SULFATE IN WATER 4 GRAM/50 ML (8 %) IV PGBK
4 g | Freq: Once | INTRAVENOUS | 0 refills | Status: CP
Start: 2017-07-14 — End: ?
  Administered 2017-07-14: 19:00:00 4 g via INTRAVENOUS

## 2017-07-14 MED ORDER — NOREPINEPHRINE IV DRIP (STD CONC)
.01-.4 ug/kg/min | INTRAVENOUS | 0 refills | Status: DC
Start: 2017-07-14 — End: 2017-07-16

## 2017-07-14 MED ORDER — ROCURONIUM 10 MG/ML IV SOLN
INTRAVENOUS | 0 refills | Status: DC
Start: 2017-07-14 — End: 2017-07-14
  Administered 2017-07-14: 14:00:00 10 mg via INTRAVENOUS
  Administered 2017-07-14: 14:00:00 50 mg via INTRAVENOUS
  Administered 2017-07-14: 17:00:00 20 mg via INTRAVENOUS
  Administered 2017-07-14: 16:00:00 50 mg via INTRAVENOUS
  Administered 2017-07-14: 13:00:00 60 mg via INTRAVENOUS
  Administered 2017-07-14: 13:00:00 30 mg via INTRAVENOUS

## 2017-07-14 MED ORDER — ACETAMINOPHEN 650 MG RE SUPP
650 mg | RECTAL | 0 refills | Status: DC | PRN
Start: 2017-07-14 — End: 2017-07-18

## 2017-07-14 MED ORDER — ACETAMINOPHEN 500 MG PO TAB
1000 mg | ORAL | 0 refills | Status: CP
Start: 2017-07-14 — End: ?
  Administered 2017-07-14 – 2017-07-18 (×16): 1000 mg via ORAL

## 2017-07-14 MED ORDER — DEXMEDETOMIDINE IV DRIP (STD CONC)
0 refills | Status: DC
Start: 2017-07-14 — End: 2017-07-14
  Administered 2017-07-14 (×2): 1 ug/kg/h via INTRAVENOUS

## 2017-07-14 MED ORDER — ALUM-MAG HYDROXIDE-SIMETH 200-200-20 MG/5 ML PO SUSP
30 mL | ORAL | 0 refills | Status: DC | PRN
Start: 2017-07-14 — End: 2017-07-20
  Administered 2017-07-18 (×2): 30 mL via ORAL

## 2017-07-14 MED ORDER — NOREPINEPHRINE IV DRIP (STD CONC)
INTRAVENOUS | 0 refills | Status: DC
Start: 2017-07-14 — End: 2017-07-14
  Administered 2017-07-14 (×2): .03 ug/kg/min via INTRAVENOUS

## 2017-07-14 MED ORDER — ONDANSETRON HCL 4 MG PO TAB
4 mg | ORAL | 0 refills | Status: DC | PRN
Start: 2017-07-14 — End: 2017-07-20

## 2017-07-14 MED ORDER — PAPAVERINE 60 MG SYRINGE (OR)
0 refills | Status: DC
Start: 2017-07-14 — End: 2017-07-14
  Administered 2017-07-14 (×2): 20 mL

## 2017-07-14 MED ORDER — POTASSIUM CHLORIDE IN WATER 10 MEQ/50 ML IV PGBK
10 meq | INTRAVENOUS | 0 refills | Status: DC | PRN
Start: 2017-07-14 — End: 2017-07-20

## 2017-07-14 MED ORDER — AMIODARONE 200 MG PO TAB
400 mg | Freq: Two times a day (BID) | ORAL | 0 refills | Status: DC
Start: 2017-07-14 — End: 2017-07-14

## 2017-07-14 MED ORDER — ONDANSETRON HCL (PF) 4 MG/2 ML IJ SOLN
4 mg | INTRAVENOUS | 0 refills | Status: DC | PRN
Start: 2017-07-14 — End: 2017-07-20

## 2017-07-14 MED ORDER — PROPOFOL INJ 10 MG/ML IV VIAL
0 refills | Status: DC
Start: 2017-07-14 — End: 2017-07-14
  Administered 2017-07-14: 13:00:00 50 mg via INTRAVENOUS

## 2017-07-14 MED ORDER — BISACODYL 10 MG RE SUPP
10 mg | Freq: Every day | RECTAL | 0 refills | Status: DC | PRN
Start: 2017-07-14 — End: 2017-07-20

## 2017-07-14 MED ORDER — ELECTROLYTE-A IV SOLP
0 refills | Status: DC
Start: 2017-07-14 — End: 2017-07-14
  Administered 2017-07-14: 13:00:00 via INTRAVENOUS

## 2017-07-14 MED ORDER — PRAMIPEXOLE 0.25 MG PO TAB
.25 mg | Freq: Every evening | ORAL | 0 refills | Status: DC
Start: 2017-07-14 — End: 2017-07-20
  Administered 2017-07-14 – 2017-07-20 (×6): 0.25 mg via ORAL

## 2017-07-14 MED ORDER — ATROPINE 0.1 MG/ML IJ SYRG
.5 mg | Freq: Once | INTRAVENOUS | 0 refills | Status: AC | PRN
Start: 2017-07-14 — End: ?

## 2017-07-14 MED ORDER — ACETAMINOPHEN 325 MG PO TAB
650 mg | ORAL | 0 refills | Status: DC | PRN
Start: 2017-07-14 — End: 2017-07-18

## 2017-07-14 MED ORDER — ALBUTEROL SULFATE 90 MCG/ACTUATION IN HFAA
2 | RESPIRATORY_TRACT | 0 refills | Status: DC | PRN
Start: 2017-07-14 — End: 2017-07-20

## 2017-07-14 MED ORDER — CEFAZOLIN INJ 1GM IVP
2 g | INTRAVENOUS | 0 refills | Status: CP
Start: 2017-07-14 — End: ?
  Administered 2017-07-15 (×3): 2 g via INTRAVENOUS

## 2017-07-14 MED ORDER — ASPIRIN 300 MG RE SUPP
300 mg | Freq: Every day | RECTAL | 0 refills | Status: DC
Start: 2017-07-14 — End: 2017-07-14

## 2017-07-14 MED ORDER — SODIUM CHLORIDE 0.9 % IV SOLP
INTRAVENOUS | 0 refills | Status: DC
Start: 2017-07-14 — End: 2017-07-15

## 2017-07-14 MED ORDER — CEFAZOLIN 1 GRAM IJ SOLR
0 refills | Status: DC
Start: 2017-07-14 — End: 2017-07-14
  Administered 2017-07-14: 17:00:00 2 g via INTRAVENOUS
  Administered 2017-07-14: 13:00:00 3 g via INTRAVENOUS

## 2017-07-14 MED ORDER — SODIUM CHLORIDE 0.9 % IV SOLP
0 refills | Status: DC
Start: 2017-07-14 — End: 2017-07-14
  Administered 2017-07-14: 14:00:00 5000 mL

## 2017-07-14 MED ORDER — FENTANYL CITRATE (PF) 50 MCG/ML IJ SOLN
25-50 ug | INTRAVENOUS | 0 refills | Status: DC | PRN
Start: 2017-07-14 — End: 2017-07-16
  Administered 2017-07-15 (×7): 50 ug via INTRAVENOUS

## 2017-07-14 MED ORDER — SENNOSIDES-DOCUSATE SODIUM 8.6-50 MG PO TAB
2 | Freq: Two times a day (BID) | ORAL | 0 refills | Status: DC
Start: 2017-07-14 — End: 2017-07-20
  Administered 2017-07-15 – 2017-07-19 (×8): 2 via ORAL

## 2017-07-14 MED ORDER — POTASSIUM CHLORIDE 20 MEQ/15 ML PO LIQD
20-40 meq | NASOGASTRIC | 0 refills | Status: DC | PRN
Start: 2017-07-14 — End: 2017-07-20

## 2017-07-14 MED ORDER — HYDRALAZINE 20 MG/ML IJ SOLN
10 mg | INTRAVENOUS | 0 refills | Status: DC | PRN
Start: 2017-07-14 — End: 2017-07-20
  Administered 2017-07-16: 01:00:00 10 mg via INTRAVENOUS

## 2017-07-14 MED ORDER — FENTANYL CITRATE (PF) 50 MCG/ML IJ SOLN
0 refills | Status: DC
Start: 2017-07-14 — End: 2017-07-14
  Administered 2017-07-14: 13:00:00 300 ug via INTRAVENOUS
  Administered 2017-07-14 (×2): 100 ug via INTRAVENOUS
  Administered 2017-07-14: 18:00:00 50 ug via INTRAVENOUS
  Administered 2017-07-14: 18:00:00 100 ug via INTRAVENOUS
  Administered 2017-07-14: 13:00:00 250 ug via INTRAVENOUS
  Administered 2017-07-14: 14:00:00 50 ug via INTRAVENOUS
  Administered 2017-07-14: 14:00:00 200 ug via INTRAVENOUS
  Administered 2017-07-14: 17:00:00 100 ug via INTRAVENOUS
  Administered 2017-07-14: 18:00:00 200 ug via INTRAVENOUS
  Administered 2017-07-14: 18:00:00 100 ug via INTRAVENOUS

## 2017-07-14 MED ORDER — AUTOLOGOUS BLOOD (CELL SAVER)
0 refills | Status: DC
Start: 2017-07-14 — End: 2017-07-14

## 2017-07-14 MED ORDER — MIDAZOLAM 1 MG/ML IJ SOLN
1-5 mg | INTRAVENOUS | 0 refills | Status: DC | PRN
Start: 2017-07-14 — End: 2017-07-14

## 2017-07-14 MED ORDER — SODIUM CHLORIDE 0.9 % IV SOLP
INTRAVENOUS | 0 refills | Status: DC
Start: 2017-07-14 — End: 2017-07-16
  Administered 2017-07-15: 19:00:00 1000.000 mL via INTRAVENOUS

## 2017-07-14 MED ADMIN — METOPROLOL TARTRATE 25 MG PO TAB [37637]: 25 mg | ORAL | @ 12:00:00 | Stop: 2017-07-14 | NDC 00904634061

## 2017-07-14 NOTE — Operative Report(Direct Entry)
OPERATIVE REPORT    Name: Gregory Acosta is a 61 y.o. male     DOB: February 02, 1956             MRN#: [id number]    DATE OF OPERATION: 07/14/2017    Surgeon(s) and Role:     * Daniel Nones, MD - Fellow     * Lynne Logan, MD - Primary       Caryl Ada PA-C     Preoperative Diagnosis:    Coronary artery disease involving native coronary artery of native heart, angina presence unspecified [I25.10]    Post-op Diagnosis      * Coronary artery disease involving native coronary artery of native heart, angina presence unspecified [I25.10]    Procedure(s):  BYPASS GRAFT CORONARY ARTERYx4 , LIMA, LEVH    Anesthesia Type: Defer to Anesthesia    Description and Findings of Operative Procedure:        After satisfactory induction of general anesthesia, the patient is prepped and draped in sterile fashion. A median sternotomy was performed. The LIMA was harvested and simultaneously, the left greater saphenous vein was harvested endoscopically and prepared. It was an good quality conduit, and the LIMA was an excellent conduit.    The patient was then heparinized, cannulated via the ascending aorta and right atrial appendage. The ascending aorta was soft and noncalcific. After satisfactory ACT, normothermic cardiopulmonary bypass was instituted and the ascending aorta was occluded at its midportion. The heart was arrested with high-potassium cardioplegia and became flaccid. Cardioplegia was delivered at least every 20 minutes as needed.   The PLA was grafted with a reverse segment of saphenous vein in an end-to-side fashion (1.75 mm with diffuse disease). The proximal was constructed on the ascending aorta with a running 6-0 Prolene. A second segment of RSVG was grafted to the second OM artery in an end to side fashion (2.0 mm vessel, with diffuse disease), and the proximal was attached to the ascending aorta.???A third segment of RSVG was grafted to the first OM artery (occluded on cath) and its proximal was attached to the OM2 vein graft.  The LIMA was then tunneled through a slit in the pericardium, and then grafted to the mid-distal LAD in an end to side fashion (1.5 mm vessel, with diffuse disease).     The heart was de-aired and the cross clamp was removed. Cardiac activity resumed. The grafts were inspected and found to be patent by Doppler examination, each with a strong diastolic signal. All anastomoses appeared hemostatic. The patient was then allowed to ventilate and eject and was slowly weaned from cardiopulmonary bypass without difficulty. After a period of hemodynamic stability, he was decannulated and cannulation sites were repaired. Protamine was administered. Temporary pacing wires and mediastinal and pleural drains were inserted, exteriorized, and secured. When hemostasis was satisfactory, the incision was closed with steel wires for the sternum, and running absorbable suture in layers for the rest of the closure. The drains were connected to suction and sterile bandages were applied.    The patient was then transported to the CTS ICU in stable condition.     Estimated Blood Loss:  No blood loss documented.     Specimen(s) Removed/Disposition: * No specimens in log *    Attestation: I performed this procedure with a resident.    Lynne Logan, MD

## 2017-07-14 NOTE — Progress Notes
CARDIOPULMONARY REHABILITATION  INPATIENT ASSESSMENT    Cardiac Rehabilitation Staff: Mardene Sayer, RN Discharge Date:     Demographics  Pre-admit Dx: Chest Pain Date of Admission: 07/10/2017     Room: HC306/01 DOB:  November 10, 1956   Insurance: Primary: [employer] [place]  Secondary: unknown   Address: [address]  [place] [address]   Patient Phone:  [phone] (home)    Marital Status: Married  Occupation: Unknown   ED Contact: Myrna Blazer  ED Phone #: [phone]   CTS: Daon  Cardiologist: Wiley     Cardiac Procedures and Events  CABG: 07/14/17                          Risk Factors  Risk Factors: Hypertension, Obesity, Hyperlipidemia, Diabetes-type II  BP: 119/74  Height: 182.9 cm (72.01)  Weight: (!) 138.6 kg (305 lb 8.9 oz)  BMI (Calculated): 41.43      Medical History   has a past medical history of Chest pain; Essential hypertension (07/05/2017); Family history of coronary artery disease (07/05/2017); Left ventricular hypertrophy (07/05/2017); Mixed hyperlipidemia (07/05/2017); Obstructive sleep apnea syndrome (07/05/2017); Peripheral artery disease (HCC) (07/05/2017); Restless leg syndrome; Spinal stenosis; and Type 2 diabetes mellitus without complication, without long-term current use of insulin (HCC) (07/05/2017).    Labs  Cholesterol   Date Value Ref Range Status   07/11/2017 282 (H) <200 MG/DL Final     Triglycerides   Date Value Ref Range Status   07/11/2017 329 (H) <150 MG/DL Final     HDL   Date Value Ref Range Status   07/11/2017 39 (L) >40 MG/DL Final     LDL   Date Value Ref Range Status   07/11/2017 204 (H) <100 MG/DL Final     Hemoglobin W4X   Date Value Ref Range Status   07/10/2017 10.9 (H) 4.0 - 6.0 % Final     Comment:     The ADA recommends that most patients with type 1 and type 2 diabetes maintain   an A1c level <7%.       Troponin-I   Date Value Ref Range Status   07/12/2017 0.01 0.0 - 0.05 NG/ML Final         Heart Resource Manual Given:       Teaching Completed: Outpatient Cardiopulmonary Rehabilitation    OPCR:      Referral Faxed to:       Date Faxed:      Location:      [facility], Sent to Staff:            Junious Dresser, RN  07/14/2017

## 2017-07-14 NOTE — Progress Notes
NP [facility] notified pt. CI 1.62, NP stated to send mixed venous lab and to start dobutamine@ 3 mcg/kg/min. RN to follow orders and will continue to monitor.

## 2017-07-14 NOTE — Progress Notes
NP [facility] notified pt. FICK CI 2.22, NP stated to wait to start dobutamine and continue to monitor pt. RN will continue to monitor.

## 2017-07-14 NOTE — Progress Notes
RESPIRATORY THERAPY  ADULT PROTOCOL EVALUATION      RESPIRATORY PROTOCOL PLAN    Medications  Albuterol: MDI PRN    Note: If indicated by protocol, medication orders will be placed by therapist.    Procedures  PEP Therapy: Place a nursing order for IS Q1h While Awake for any of Lung Expansion indicators  PAP: Q4h PAP While Awake  IPPB: Q4h PEP While Awake  Oxygen/Humidity: O2 to keep SpO2 > 92%  Monitoring: Pulse oximetry BID & PRN       _____________________________________________________________    PATIENT EVALUATION RESULTS    Chart Review  * Pulmonary Hx: Hx pulmonary disease, hx reactive or obstructive airway disease (PEFR & AM) OR regular home use of bronchodilators (AM) OR inhaled or systemic steroid use for lungs < or equal to 4 times/yr (AM)    * Surgical Hx: Thoracic or abdominal surgery with reactive or obstructive airway disease (LE) (AM)    * Chest X-Ray: Clear OR not available    * PFT/Oxygenation: FEV1, PEFR < 60% OR Pa02 < 60 or Sp02 <90% RA OR Fi02 > 0.30 to keep Sp02 >92% OR Sickle Cell Crisis OR Anemia (<10) OR Acute MI (02 & oxim)      Patient Assessment  * Respiratory Pattern: Regular pattern and rate OR good chest excursion with deep breathing    * Breath Sounds: Clear apically, but diminished in bases (LE) OR CHF related crackles (02) (oximetry)    * Cough / Sputum: Good effective cough OR occasional or minimal sputum OR thin sputum    * Mental Status: Alert, oriented, cooperative    * Activity Level: Ambulatory with assistance      Priority Index  Total Points: 13 Points  * Priority Index: 2+    PRIORITY INDEX GUIDELINES*  Priority Points   1 0-9 points   2 9-18 points   3 > 18 points   + Pulm Dx or Home Rx   *Higher points indicate higher acuity.      Therapist: Johnston Ebbs, RT  Date: 07/14/2017      Key  AC = Airway clearance  AM = Aerosolized medication  BA = Bland aerosol  DB&C = Deep breathe & cough  FEV1 = Forced expiratory volume in first second)  IC = Inspiratory capacity LE = Lung expansion  MDI = Metered dose inhaler  Neb = Nebulizer  O2 = Oxygen  Oxim =Oximetry  PEFR = Peak expiratory flow rate  RRT = Rapid Response Team

## 2017-07-14 NOTE — Progress Notes
[facility] prior to his CTS procedure scheduled for 07/14/2017.  Medications that should not be given on the morning of surgery have been [facility] as [facility]  All other medications should be given prior to surgery as scheduled.     Teodora MediciPatrick O'Day, PharmD

## 2017-07-14 NOTE — Progress Notes
Cardiothoracic Surgery Critical Care   Progress Note     Gregory Acosta  Today's Date:  07/14/2017  Admission Date: 07/10/2017  LOS: 4 days    POD: 0    Procedure: BYPASS GRAFT CORONARY ARTERYx , LIMA, LEVH:     Principal Problem:    Unstable angina (HCC)  Active Problems:    Type 2 diabetes mellitus without complication, without long-term current use of insulin (HCC)    Essential hypertension    Mixed hyperlipidemia    Obstructive sleep apnea syndrome    Obesity    Coronary artery disease involving native coronary artery of native heart    Chronic diastolic CHF (congestive heart failure), NYHA class 2 (HCC)        Assessment/Plan:      Neuro ??? Continue to wean from sedation to work towards extubation. Continue PRN Fentanyl, oxycodone with scheduled Tylenol.   CV ??? SR, BP 117/69, CI 1.63, PA 31/19, CVP 14. 500 albumin now for low CI.  Currently requiring nitroglycerin infusion for BP management. Cont ASA 81 mg (hold for plts <80k) and statin once taking PO. Hold BB and ACEI in the initial post op phase. Intra op TEE LVEF 50%.  Resp ??? Daily CXR ??? bedside interpretation: lungs inflated, low lung volumes with bilateral effusion, ETT/PAC/CTs in place. Will await radiology report. ABG 7.40/39/172/24. Wean FiO2 first then PEEP. FEV1 55% Initiate IS, aggressive pulm toilet once extubated. Chest tubes plan with minimal output, monitor closely.   Renal ??? Baseline Scr 1. Monitor BMP, assess for AKI.   GI - ADAT once extubated, GI prophylaxis while intubated, start post op bowel regimen on POD 1.   ID ??? Continue standard post op antibiotic for prophylaxis, per CTS protocol.  Heme ??? Check post op CBC/coags, assess for coagulopathy. Hold DVT prophylaxis until cleared by CTS, continue mechanical prophylaxis.   FEN ??? If creatinine < 2.0, replace Mg and K per CTS post op protocol. Hgb A1c 10.5 %. Insulin gtt per Modified [facility] Protocol.    Activity ??? HOB to 30 degrees over 1st postop hour. Bedrest until extubated, dangle 6 hours after extubation. Early cardiac PT/OT.     Prophylaxis Review:  Lines:  Yes; Arterial Line; Indication:  Frequent blood draws and Continuous BP monitoring; Location:  Radial  Central Line; Indication:  Hemodynamic monitoring; Type:  Internal jugular  Antibiotic Usage:  Yes; Infection present or suspected:  CTS prophylaxis  VTE:  Mechanical prophylaxis; Foot pump  Urinary Catheter: Yes; Retain foley due to:  Need for accurate Intake and Output    Disposition: The patient is post-cardiac surgery at at risk for life threatening deterioration. Patient is critically ill s/p CABG requiring a nitroglycerin infusion for BP support and mechanical ventilation.  Continue ICU care.     I have seen, personally fully evaluated, and discussed patient with the critical care attending and cardiothoracic surgeon. The patient is critically ill s/p CABG. I spent 60 minutes (excluding time spent performing procedures) providing and personally directing critical care services including direct OR recovery, ventilator management, hemodynamic monitoring and management, lab and radiology review, medication review and management, fluid and electrolyte management and coordination of care.     Starling Manns, APRN   CTS Intensive Care  Pager [phone]  07/14/2017    Subjective:       HPI:   Gregory Acosta is a 61 y.o. male with history of obesity, uncontrolled DM (A1C 10.5), HTN, HLD, and OSA.  He presented to the  ED on 7/14 with chest heaviness and pressure along with exertional SOA.  His cath results were positive for multivessel disease.  He is now s/p CABG with Dr. Elias Else.       REVIEW OF SYSTEMS:   Review of systems not obtained from patient due to patient factors.      Objective:        Medications:  Scheduled Meds:  ALBUMIN, HUMAN 5 % IV SOLP (Cabinet Override)   NOW   [MAR Hold] aspirin EC tablet 81 mg 81 mg Oral QDAY   [MAR Hold] ezetimibe (ZETIA) tablet 10 mg 10 mg Oral QDAY [MAR Hold] fluticasone (FLOVENT HFA) 110 mcg/actuation inhaler 2 puff 2 puff Inhalation BID   [MAR Hold] heparin (porcine) BOLUS for continuous inf (bag) [phone] Units 20-40 Units/kg Intravenous As Prescribed   [MAR Hold] insulin aspart U-100 (NOVOLOG FLEXPEN) injection PEN 25 Units 25 Units Subcutaneous TID w/ meals   [MAR Hold] losartan (COZAAR) tablet 100 mg 100 mg Oral QDAY   MAGNESIUM SULFATE IN WATER 4 GRAM/50 ML (8 %) IV PGBK (Cabinet Override)   NOW   [MAR Hold] metoprolol tartrate (LOPRESSOR) tablet 25 mg 25 mg Oral BID   POTASSIUM CHLORIDE IN WATER 10 MEQ/50 ML IV PGBK (Cabinet Override)   NOW   [MAR Hold] rosuvastatin (CRESTOR) tablet 10 mg 10 mg Oral Once per day on Mon Thu   SODIUM CHLORIDE 0.9 % IV SOLP (Cabinet Override)   NOW   Continuous Infusions:  ??? heparin (porcine) 20,000 units/D5W 500 mL infusion (std conc)(premade) 1,800 Units/hr (07/14/17 0351)   ??? insulin regular (NOVOLIN R) 100 Units in sodium chloride 0.9% (NS) 100 mL IV drip (std conc) 7 Units/hr (07/14/17 0358)   ??? nitroGLYCERIN 50 mg/D5W 250 mL infusion 0.5 mcg/kg/min (07/14/17 0000)   ??? sodium chloride 0.9 %   infusion       PRN and Respiratory Meds:[MAR Hold] acetaminophen Q4H PRN, [MAR Hold] albuterol Q4H PRN, [MAR Hold] aluminum/magnesium hydroxide Q4H PRN, [MAR Hold] diphenhydrAMINE Q4H PRN **OR** [MAR Hold] diphenhydrAMINE Q4H PRN, heparin 20,000 units in plasmalyte-A 1000 mL irrigation (OR) Intra-procedure Med, [MAR Hold] lidocaine PF PRN, [MAR Hold] ondansetron (ZOFRAN) IV Q6H PRN, papaverine  60 mg syringe (OR) Intra-procedure Med, [MAR Hold] pramipexole TID PRN, sodium chloride 0.9% (NS) Intra-procedure Med(cont), vancomycin Intra-procedure Med                       Vital Signs: Last Filed                  Vital Signs: 24 Hour Range   BP: 119/74 (07/18 0726)  Temp: 36.6 ???C (97.9 ???F) (07/18 0630)  Pulse: 64 (07/18 0725)  Respirations: 19 PER MINUTE (07/18 0725)  SpO2: 96 % (07/18 0725) O2 Delivery: None (Room Air) (07/18 0630)  SpO2 Pulse: 65 (07/18 0725) BP: (115-137)/(53-75)   Temp:  [36.5 ???C (97.7 ???F)-36.8 ???C (98.2 ???F)]   Pulse:  [64-84]   Respirations:  [18 PER MINUTE-24 PER MINUTE]   SpO2:  [90 %-99 %]   O2 Delivery: None (Room Air)   Intensity Pain Scale 0-10 (Pain 1): 2 (07/14/17 0630) Vitals:    07/10/17 2327 07/12/17 0822 07/13/17 0600   Weight: 134.1 kg (295 lb 9.6 oz) (!) 137.9 kg (304 lb) (!) 138.6 kg (305 lb 8.9 oz)           Intake/Output Summary:  (Last 24 hours)    Intake/Output Summary (Last 24 hours) at 07/14/17 1032  Last data filed at 07/14/17 1007   Gross per 24 hour   Intake          2656.34 ml   Output             4525 ml   Net         -1868.66 ml         Physical Exam:         Neuro: Sedated, PERRL   Cardiovascular: RRR no rub or murmur  Respiratory: LS CTA bil - diminished in the bases  GI: soft, NT, hypoactive BS  Extremities: No Edema  Incisions: Sternal incision dressing, dry and intact. No crepitus or sternal instability.      Chest Tubes OUTPUT/24 HOURS AIR LEAK PRESENT   Mediastinal 40 yes   Mediastinal     Pleural      Pleural        Artificial airway:  Endotracheal Tube                                                                                         Vent weaning trial:  Per protocol    Laboratory:  LABS:  Recent Labs      07/11/17   1700  07/12/17   0355  07/13/17   0320  07/14/17   0300   NA  136*  136*  136*  136*   K  3.9  3.3*  4.4  3.6   CL  102  106  106  105   CO2  22  23  20*  23   GAP  12  7  10  8    BUN  24  19  14  15    CR  0.99  0.74  0.96  1.00   GLU  187*  142*  127*  237*   CA  9.1  7.9*  9.1  8.6   ALBUMIN   --   2.9*  3.8  3.2*   MG   --   2.1  2.6  2.2       Recent Labs      07/11/17   1130  07/11/17   1700  07/11/17   1836  07/11/17   2218  07/12/17   0030  07/12/17   0355  07/12/17   0642  07/12/17   1515  07/12/17   2120  07/13/17   0320  07/13/17   1000  07/13/17   1220  07/13/17   2000  07/14/17   0300 WBC   --    --    --    --    --   14.6*   --    --    --   16.7*   --    --    --   15.2*   HGB   --    --    --    --    --   11.3*   --    --    --   12.7*   --    --    --   11.2*  HCT   --    --    --    --    --   33.2*   --    --    --   37.8*   --    --    --   33.4*   PLTCT   --    --    --    --    --   228   --    --    --   266   --    --    --   227   INR   --    --    --    --    --    --    --    --    --    --   1.1   --    --    --    PTT   --    --   39.0   --   53.2*   --   65.9*  86.5*  58.7*  139.2*   --   71.7*  80.0*  83.4*   AST   --    --    --    --    --   36   --    --    --   31   --    --    --   32   ALT   --    --    --    --    --   33   --    --    --   39   --    --    --   38   ALKPHOS   --    --    --    --    --   39   --    --    --   52   --    --    --   52   TNI  0.01  0.01   --   <0.01   --   0.01   --    --    --    --    --    --    --    --       Estimated Creatinine Clearance: 111.9 mL/min (based on SCr of 1 mg/dL).  Vitals:    07/10/17 2327 07/12/17 0822 07/13/17 0600   Weight: 134.1 kg (295 lb 9.6 oz) (!) 137.9 kg (304 lb) (!) 138.6 kg (305 lb 8.9 oz)    No results for input(s): PHART, PO2ART in the last 72 hours.    Invalid input(s): PC02A        Radiology and Other Diagnostic Procedures Review:    Reviewed

## 2017-07-14 NOTE — Progress Notes
NP [facility] notified pt. CI with thermodilution 1.84. NP stated to draw mixed venous and calculate FICK. RN to send lab and will continue to monitor.

## 2017-07-14 NOTE — Progress Notes
Patient reassessed. Pt. intubated/sedated. Pt. follows simple commands and moves all extremities. Afebrile. SR. MAP > 65. CVP 16. PAP 33/20.  Lung sounds clear/diminished. SpO2 in upper 90's on 60% FiO2. Chest tubes with minimal output. Bowel sounds hypoactive. Adequte urine output.     Drips:  precedex@ 0.6 mcg/kg/hr  insulin@ 5 units/hr  NS@ 6330mL/hr

## 2017-07-14 NOTE — Progress Notes
Critical Care Progress Note          Today's Date:  07/14/2017  Name:  Gregory Acosta                       MRN:  [id number]   Admission Date: 07/10/2017  LOS: 4 days                     Assessment/Plan:   Principal Problem:    Unstable angina (HCC)  Active Problems:    Type 2 diabetes mellitus without complication, without long-term current use of insulin (HCC)    Essential hypertension    Mixed hyperlipidemia    Obstructive sleep apnea syndrome    Obesity    Coronary artery disease involving native coronary artery of native heart    Chronic diastolic CHF (congestive heart failure), NYHA class 2 (HCC)        61 yo male with OSA on CPAP, spinal stenosis, uncontrolled IDDM (HbA1c = 10.9), PAD, COPD, LVH    Neuro: Prn pain meds; prn Xanax, PTA Mirapex  Cardiac: bASA, Crestor, Metoprolol when able  Pulmonary: CMV now; plan 6 hour extubation   FEN: NPO  ID:  periop abx  Renal:  stable  Heme:  stable  Endo:  Modified [facility] Insulin Protocol, Appreciate endocrine consult  Prophylaxis: HOB>40, PPI, DVT prophylaxis per primary team  Disposition/Family: Needs ICU   __________________________________________________________________________________  Subjective:  Gregory Acosta is a 61 y.o. male.  Overnight Events: Significant event:   from Or.  Patient stable  Objective:  Medications:  Scheduled Meds:  ALBUMIN, HUMAN 5 % IV SOLP (Cabinet Override)   NOW   [MAR Hold] aspirin EC tablet 81 mg 81 mg Oral QDAY   [MAR Hold] ezetimibe (ZETIA) tablet 10 mg 10 mg Oral QDAY   [MAR Hold] fluticasone (FLOVENT HFA) 110 mcg/actuation inhaler 2 puff 2 puff Inhalation BID   [MAR Hold] heparin (porcine) BOLUS for continuous inf (bag) [phone] Units 20-40 Units/kg Intravenous As Prescribed   [MAR Hold] insulin aspart U-100 (NOVOLOG FLEXPEN) injection PEN 25 Units 25 Units Subcutaneous TID w/ meals   [MAR Hold] losartan (COZAAR) tablet 100 mg 100 mg Oral QDAY   MAGNESIUM SULFATE IN WATER 4 GRAM/50 ML (8 %) IV PGBK (Cabinet Override)   NOW [MAR Hold] metoprolol tartrate (LOPRESSOR) tablet 25 mg 25 mg Oral BID   POTASSIUM CHLORIDE IN WATER 10 MEQ/50 ML IV PGBK (Cabinet Override)   NOW   [MAR Hold] rosuvastatin (CRESTOR) tablet 10 mg 10 mg Oral Once per day on Mon Thu   SODIUM CHLORIDE 0.9 % IV SOLP (Cabinet Override)   NOW   Continuous Infusions:  ??? heparin (porcine) 20,000 units/D5W 500 mL infusion (std conc)(premade) 1,800 Units/hr (07/14/17 0351)   ??? insulin regular (NOVOLIN R) 100 Units in sodium chloride 0.9% (NS) 100 mL IV drip (std conc) 7 Units/hr (07/14/17 0358)   ??? nitroGLYCERIN 50 mg/D5W 250 mL infusion 0.5 mcg/kg/min (07/14/17 0000)   ??? sodium chloride 0.9 %   infusion       PRN and Respiratory Meds:[MAR Hold] acetaminophen Q4H PRN, [MAR Hold] albuterol Q4H PRN, [MAR Hold] aluminum/magnesium hydroxide Q4H PRN, [MAR Hold] diphenhydrAMINE Q4H PRN **OR** [MAR Hold] diphenhydrAMINE Q4H PRN, heparin 20,000 units in plasmalyte-A 1000 mL irrigation (OR) Intra-procedure Med, [MAR Hold] lidocaine PF PRN, [MAR Hold] ondansetron (ZOFRAN) IV Q6H PRN, papaverine  60 mg syringe (OR) Intra-procedure Med, [MAR Hold] pramipexole TID PRN, sodium chloride 0.9% (NS) Intra-procedure Med(cont),  vancomycin Intra-procedure Med                     Vital Signs: Last Filed                  Vital Signs: 24 Hour Range   BP: 119/74 (07/18 0726)  Temp: 36.6 ???C (97.9 ???F) (07/18 0630)  Pulse: 64 (07/18 0725)  Respirations: 19 PER MINUTE (07/18 0725)  SpO2: 96 % (07/18 0725)  O2 Delivery: None (Room Air) (07/18 0630)  BP: (115-137)/(53-75)   Temp:  [36.5 ???C (97.7 ???F)-36.8 ???C (98.2 ???F)]   Pulse:  [64-84]   Respirations:  [18 PER MINUTE-24 PER MINUTE]   SpO2:  [90 %-99 %]   O2 Delivery: None (Room Air)    Intensity Pain Scale 0-10 (Pain 1): (not recorded) Vitals:    07/10/17 2327 07/12/17 0822 07/13/17 0600   Weight: 134.1 kg (295 lb 9.6 oz) (!) 137.9 kg (304 lb) (!) 138.6 kg (305 lb 8.9 oz)       Critical Care Vitals:      ICP Monitoring:     PA  Catheter: Hemodynamics/Oxycalcs:       Intake/Output Summary:  (Last 24 hours)    Intake/Output Summary (Last 24 hours) at 07/14/17 1122  Last data filed at 07/14/17 1105   Gross per 24 hour   Intake          2586.64 ml   Output             4825 ml   Net         -2238.36 ml         Physical Exam:  General:  no distress, appears stated age  Lungs:  Clear to auscultation bilaterally  CV: RRR  Abdomen:  Soft, non-tender.  Bowel sounds normal.  No masses.  No organomegaly.  Extremities:  Extremities normal, atraumatic, no cyanosis or edema  Neurologic:  CNII - XII intact.  PERRL      Prophylaxis Review:  Lines:  Yes; Arterial Line; Indication:  Frequent blood draws and Continuous BP monitoring; Location:  Radial  Central Line; Indication:  Hemodynamic monitoring; Type:  Internal jugular  Urinary Catheter:  Yes; Retain foley due to:  Need for accurate Intake and Output  Antibiotic Usage:  No  VTE: none    Lab Review:  Pertinent labs reviewed  Point of Care Testing:  (Last 24 hours):  Glucose: (!) 237 (07/14/17 0300)  POC Glucose (Download): (!) 141 (07/14/17 1052)    Radiology and Other Diagnostic Procedures Review:    Pertinent radiology reviewed.    I have seen, examined and reviewed data concerning this patient.  I discussed the findings and plan of care with the ICU team. I spent 45 minutes in critical care time, excluding procedures today.    Georgiann Mohs, MD  Associate Professor  Anesthesiology and Critical Care  [phone]

## 2017-07-14 NOTE — Progress Notes
Mr. Gregory Acosta is a 61 y/o male with a h/o uncontrolled DM, OSA, PAD, hyperlipidemia and spinal stenosis who presented to the ED on 07/10/17 with chest heaviness and pressure.  Cath shows three vessel CAD. CTS consulted for CABG.    Labs Reviewed  BB given    OR today with Dr. Elias Elseaon for CABG

## 2017-07-15 DIAGNOSIS — R0789 Other chest pain: Secondary | ICD-10-CM

## 2017-07-15 LAB — POC GLUCOSE
Lab: 125 mg/dL — ABNORMAL HIGH (ref 70–100)
Lab: 120 mg/dL — ABNORMAL HIGH (ref 70–100)
Lab: 134 mg/dL — ABNORMAL HIGH (ref 70–100)
Lab: 117 mg/dL — ABNORMAL HIGH (ref 70–100)
Lab: 123 mg/dL — ABNORMAL HIGH (ref 70–100)
Lab: 141 mg/dL — ABNORMAL HIGH (ref 70–100)
Lab: 153 mg/dL — ABNORMAL HIGH (ref >40)
Lab: 151 mg/dL — ABNORMAL HIGH (ref 70–100)
Lab: 169 mg/dL — ABNORMAL HIGH (ref 70–100)
Lab: 174 mg/dL — ABNORMAL HIGH (ref 70–100)
Lab: 174 mg/dL — ABNORMAL HIGH (ref 70–100)
Lab: 175 mg/dL — ABNORMAL HIGH (ref 70–100)
Lab: 203 mg/dL — ABNORMAL HIGH (ref 70–100)
Lab: 177 mg/dL — ABNORMAL HIGH (ref 70–100)
Lab: 213 mg/dL — ABNORMAL HIGH (ref 70–100)
Lab: 255 mg/dL — ABNORMAL HIGH (ref 70–100)

## 2017-07-15 LAB — BASIC METABOLIC PANEL
Lab: 106 MMOL/L — ABNORMAL LOW (ref >60)
Lab: 134 MMOL/L — ABNORMAL LOW (ref 137–147)

## 2017-07-15 LAB — O2 SATURATION, MIXED VENOUS
Lab: 46 % — ABNORMAL HIGH (ref 0.3–1.2)
Lab: 48 % — ABNORMAL HIGH (ref >60)
Lab: 52 % (ref 21–30)
Lab: 55 %
Lab: 55 % — ABNORMAL LOW (ref >60)
Lab: 58 %

## 2017-07-15 LAB — POTASSIUM: Lab: 4.6 MMOL/L (ref 3.5–5.1)

## 2017-07-15 LAB — O2 SATURATION, CENTRAL VENOUS
Lab: 35 %
Lab: 59 %

## 2017-07-15 LAB — CBC: Lab: 20 K/UL — ABNORMAL HIGH (ref >60)

## 2017-07-15 LAB — MAGNESIUM: Lab: 3.3 mg/dL — ABNORMAL HIGH (ref 1.6–2.6)

## 2017-07-15 MED ORDER — TRAMADOL 50 MG PO TAB
50-100 mg | ORAL | 0 refills | Status: DC | PRN
Start: 2017-07-15 — End: 2017-07-20
  Administered 2017-07-15 – 2017-07-19 (×12): 100 mg via ORAL
  Administered 2017-07-20: 12:00:00 50 mg via ORAL
  Administered 2017-07-20: 02:00:00 100 mg via ORAL
  Administered 2017-07-20 (×2): 50 mg via ORAL

## 2017-07-15 MED ORDER — INSULIN ASPART 100 UNIT/ML SC FLEXPEN
0-14 [IU] | Freq: Before meals | SUBCUTANEOUS | 0 refills | Status: DC
Start: 2017-07-15 — End: 2017-07-20

## 2017-07-15 MED ORDER — DOBUTAMINE IN D5W 1,000 MG/250 ML (4,000 MCG/ML) IV SOLP
3 ug/kg/min | INTRAVENOUS | 0 refills | Status: DC
Start: 2017-07-15 — End: 2017-07-15

## 2017-07-15 MED ORDER — INSULIN ASPART 100 UNIT/ML SC FLEXPEN
10 [IU] | Freq: Three times a day (TID) | SUBCUTANEOUS | 0 refills | Status: DC
Start: 2017-07-15 — End: 2017-07-16

## 2017-07-15 MED ORDER — DOBUTAMINE IN NS 4,000 MCG/ML IV (STD CONC)
1 ug/kg/min | INTRAVENOUS | 0 refills | Status: DC
Start: 2017-07-15 — End: 2017-07-16
  Administered 2017-07-15 (×2): 3 ug/kg/min via INTRAVENOUS

## 2017-07-15 MED ORDER — ALBUMIN, HUMAN 5 % IV SOLP
250 mL | Freq: Once | INTRAVENOUS | 0 refills | Status: CP
Start: 2017-07-15 — End: ?
  Administered 2017-07-15: 12:00:00 250 mL via INTRAVENOUS

## 2017-07-15 MED ORDER — OXYCODONE 5 MG PO TAB
5-10 mg | ORAL | 0 refills | Status: DC | PRN
Start: 2017-07-15 — End: 2017-07-20
  Administered 2017-07-15 – 2017-07-17 (×11): 10 mg via ORAL

## 2017-07-15 MED ORDER — FLUTICASONE 110 MCG/ACTUATION IN HFAA
2 | Freq: Two times a day (BID) | RESPIRATORY_TRACT | 0 refills | Status: DC
Start: 2017-07-15 — End: 2017-07-20
  Administered 2017-07-15: 22:00:00 2 via RESPIRATORY_TRACT

## 2017-07-15 MED ORDER — INSULIN GLARGINE 100 UNIT/ML (3 ML) SC INJ PEN
50 [IU] | Freq: Every day | SUBCUTANEOUS | 0 refills | Status: DC
Start: 2017-07-15 — End: 2017-07-16

## 2017-07-15 MED ORDER — PRAMIPEXOLE 0.25 MG PO TAB
.25 mg | Freq: Every day | ORAL | 0 refills | Status: DC | PRN
Start: 2017-07-15 — End: 2017-07-20
  Administered 2017-07-15 – 2017-07-19 (×5): 0.25 mg via ORAL

## 2017-07-15 NOTE — Progress Notes
Patient walked in hallway. Tolerated well with minimal dizziness. Hemodynamically stable throughout. Will continue to monitor.

## 2017-07-15 NOTE — Progress Notes
Endocrinology Progress Note      Name:  Gregory Acosta   Today's Date:  07/15/2017  Admission Date: 07/10/2017  LOS: 5 days         Principal Problem:    Unstable angina (HCC)  Active Problems:    Type 2 diabetes mellitus without complication, without long-term current use of insulin (HCC)    Essential hypertension    Mixed hyperlipidemia    Obstructive sleep apnea syndrome    Obesity    Coronary artery disease involving native coronary artery of native heart    Chronic diastolic CHF (congestive heart failure), NYHA class 2 (HCC)    Vasogenic shock (HCC)                   Assessment/Plan:        Gregory Acosta???is a 61 y.o. male with chest pain, HTN, HLD, PAD, RLS, diabetes type 2, spinal stenosis, OSA on CPAP, obesity, who was admitted for chest pain rule out endocrinology has been consulted for diabetes management in the setting of high PTA insulin and no insurance.  ???  Type 2???Diabetes Mellitus, uncontrolled  A1c 10.9, uncontrolled  PTA regimen: levemir 80???units BID???and humalog 50???units TID w/ meals, as well as metformin and Glimepiride  Hypoglycemic episodes on this regimen: rare 3-4 times yearly, misses a lot of meal time dosing  Follows up with Dr. [name]???for diabetes management  Diabetic-complications assessment:  ???????????????????????????????????????Retinopathy: yes.   ???????????????????????????????????????Peripheral neuropathy: yes  ???????????????????????????????????????Autonomic neuropathy: no  ???????????????????????????????????????Nephropathy: no.   ???????????????????????????????????????Macrovascular complications: Yes???known CAD  Risk Factor assessment  ???????????????????????????????????????Last lipid profile 07/11/2017  ??????????????????????????????????????????????????????????????????????????????LDL 204???(goal <70)  ??????????????????????????????????????????????????????????????????????????????TG 329???(goal <150)  ???????????????????????????????????????BP at goal (goal <???140/80)  On ACEi/ARB?: Losartan  On Statin?: intolerance  ???  Chest pain  ???  PAD  HLD  HTN: Losartan  RLS  OSA on CPAP  ???  Impression / Recommendations  Stop Gtt  Lantus 50 daily  Aspart 10 unit post meal  MDCF    Pt was discussed with Dr. Earney Hamburg, DO   Pager [phone]     Subjective Gregory Acosta is a 61 y.o. male.  Patient feeling well today after surgery still with very little appetite. Would benefit from post meal dosing.     Denies fever, chills, nausea, vomiting    Physical Exam    General:  Alert, cooperative, no distress, appears stated age  Head:  Normocephalic, atraumatic  Eyes:  EOMI  Lungs:  Non-labored  Abdomen:  Obese abdomen  Skin:   No rashes  Extremities: No cyanosis   Neurologic:  No focal neurologic deficits  Psych: appropriate affect    Medications  Scheduled Meds:  acetaminophen (TYLENOL) tablet 1,000 mg 1,000 mg Oral Q6H*   aspirin chewable tablet 81 mg 81 mg Oral QDAY   ezetimibe (ZETIA) tablet 10 mg 10 mg Oral QDAY   fluticasone (FLOVENT HFA) 110 mcg/actuation inhaler 2 puff 2 puff Inhalation BID   insulin aspart U-100 (NOVOLOG FLEXPEN) injection PEN 0-14 Units 0-14 Units Subcutaneous ACHS   insulin aspart U-100 (NOVOLOG FLEXPEN) injection PEN 10 Units 10 Units Subcutaneous TID after meals   insulin glargine (LANTUS SOLOSTAR, BASAGLAR) injection PEN 50 Units 50 Units Subcutaneous QDAY(12)   pramipexole (MIRAPEX) tablet 0.25 mg 0.25 mg Oral QHS   rosuvastatin (CRESTOR) tablet 10 mg 10 mg Oral Once per day on Mon Thu   senna/docusate (SENOKOT-S) tablet 2 tablet 2 tablet Oral BID   Continuous Infusions:  ??? DOBUTamine (DOBUTREX) 1,000 mg in  sodium chloride 0.9% (NS) 250 mL IV drip (std conc) 2 mcg/kg/min (07/15/17 1459)   ??? insulin regular (NOVOLIN R) 100 Units in sodium chloride 0.9% (NS) 100 mL IV drip (std conc) Stopped (07/15/17 1402)   ??? norepinephrine (LEVOPHED) 4 mg in dextrose 5% (D5W) 250 mL IV drip (std conc) Stopped (07/15/17 0800)   ??? sodium chloride 0.9 %   infusion 30 mL/hr at 07/15/17 1409     PRN and Respiratory Meds:[START ON 07/19/2017] acetaminophen Q6H PRN **OR** [START ON 07/19/2017] acetaminophen Q6H PRN, albuterol Q4H PRN, alum/mag hydroxide/simeth Q4H PRN, bisacodyl QDAY PRN, fentaNYL citrate PF Q1H PRN, hydrALAZINE Q6H PRN, lidocaine PF PRN, milk of magnesia (CONC) QDAY PRN, ondansetron Q6H PRN **OR** ondansetron (ZOFRAN) IV Q6H PRN, oxyCODONE Q3H PRN, potassium chloride SR PRN **OR** potassium chloride PRN **OR** potassium chloride in water PRN, pramipexole QDAY PRN, traMADol Q6H PRN      Objective                       Vital Signs: Last Filed                 Vital Signs: 24 Hour Range   BP: 117/72 (07/19 0400)  Temp: 36.8 ???C (98.2 ???F) (07/19 1600)  Pulse: 94 (07/19 1600)  Respirations: 21 PER MINUTE (07/19 1600)  SpO2: 97 % (07/19 1600)  O2 Delivery: Nasal Cannula (07/19 1600)  SpO2 Pulse: 95 (07/19 1600)  Height: 182.9 cm (72.01) (07/19 0600) BP: (89-123)/(58-76)   ABP: (88-183)/(51-89)   Temp:  [36.8 ???C (98.2 ???F)-37.5 ???C (99.5 ???F)]   Pulse:  [74-107]   Respirations:  [14 PER MINUTE-32 PER MINUTE]   SpO2:  [92 %-99 %]   O2 Delivery: Nasal Cannula   Intensity Pain Scale 0-10 (Pain 1): 4 (07/15/17 1600) Vitals:    07/12/17 0822 07/13/17 0600 07/15/17 0600   Weight: (!) 137.9 kg (304 lb) (!) 138.6 kg (305 lb 8.9 oz) 134.9 kg (297 lb 6.4 oz)       Intake/Output Summary:  (Last 24 hours)    Intake/Output Summary (Last 24 hours) at 07/15/17 1645  Last data filed at 07/15/17 1600   Gross per 24 hour   Intake          1477.08 ml   Output             1834 ml   Net          -356.92 ml      Stool Occurrence: 1    Lab Review  24-hour labs:    Results for orders placed or performed during the hospital encounter of 07/10/17 (from the past 24 hour(s))   POC GLUCOSE    Collection Time: 07/14/17  4:56 PM   Result Value Ref Range    Glucose, POC 157 (H) 70 - 100 MG/DL   POC BLOOD GAS ARTERIAL    Collection Time: 07/14/17  4:57 PM   Result Value Ref Range    PH-ART-POC 7.41 7.35 - 7.45    PCO2-ART-POC 33 (L) 35 - 45 MMHG    PO2-ART-POC 89 80 - 100 MMHG    Base Def-ART-POC 4.0 MMOL/L    O2 Sat-ART-POC 97.0 95 - 99 %    Bicarbonate-ART-POC 20.9 (L) 21 - 28 MMOL/L   POC GLUCOSE    Collection Time: 07/14/17  5:48 PM Result Value Ref Range    Glucose, POC 129 (H) 70 - 100 MG/DL   POC BLOOD GAS ARTERIAL  Collection Time: 07/14/17  5:51 PM   Result Value Ref Range    PH-ART-POC 7.44 7.35 - 7.45    PCO2-ART-POC 31 (L) 35 - 45 MMHG    PO2-ART-POC 80 80 - 100 MMHG    Base Def-ART-POC 3.0 MMOL/L    O2 Sat-ART-POC 96.0 95 - 99 %    Bicarbonate-ART-POC 20.9 (L) 21 - 28 MMOL/L   BLOOD GASES, ARTERIAL    Collection Time: 07/14/17  6:34 PM   Result Value Ref Range    pH-Arterial 7.41 7.35 - 7.45    pCO2-Arterial 33 (L) 35 - 45 MMHG    pO2-Arterial 79 (L) 80 - 100 MMHG    Base Deficit-Arterial 3.0 MMOL/L    O2 Sat-Arterial 95.6 95 - 99 %    Bicarbonate-ART-Cal 21.8 21 - 28 MMOL/L   O2 SATURATION, MIXED VENOUS    Collection Time: 07/14/17  6:49 PM   Result Value Ref Range    O2Sat-Mixed Venous 55.1 %   POC GLUCOSE    Collection Time: 07/14/17  6:58 PM   Result Value Ref Range    Glucose, POC 138 (H) 70 - 100 MG/DL   POC GLUCOSE    Collection Time: 07/14/17  7:56 PM   Result Value Ref Range    Glucose, POC 125 (H) 70 - 100 MG/DL   MAGNESIUM    Collection Time: 07/14/17  8:00 PM   Result Value Ref Range    Magnesium 3.3 (H) 1.6 - 2.6 mg/dL   POTASSIUM    Collection Time: 07/14/17  8:00 PM   Result Value Ref Range    Potassium 4.6 3.5 - 5.1 MMOL/L   O2 SATURATION, MIXED VENOUS    Collection Time: 07/14/17  8:03 PM   Result Value Ref Range    O2Sat-Mixed Venous 58.4 %   POC GLUCOSE    Collection Time: 07/14/17  8:57 PM   Result Value Ref Range    Glucose, POC 120 (H) 70 - 100 MG/DL   POC GLUCOSE    Collection Time: 07/14/17 10:07 PM   Result Value Ref Range    Glucose, POC 134 (H) 70 - 100 MG/DL   POC GLUCOSE    Collection Time: 07/14/17 10:58 PM   Result Value Ref Range    Glucose, POC 117 (H) 70 - 100 MG/DL   O2 SATURATION, MIXED VENOUS    Collection Time: 07/14/17 11:55 PM   Result Value Ref Range    O2Sat-Mixed Venous 52.9 %   POC GLUCOSE    Collection Time: 07/14/17 11:57 PM   Result Value Ref Range Glucose, POC 123 (H) 70 - 100 MG/DL   POC GLUCOSE    Collection Time: 07/15/17  1:08 AM   Result Value Ref Range    Glucose, POC 141 (H) 70 - 100 MG/DL   POC GLUCOSE    Collection Time: 07/15/17  1:57 AM   Result Value Ref Range    Glucose, POC 153 (H) 70 - 100 MG/DL   CBC    Collection Time: 07/15/17  2:00 AM   Result Value Ref Range    White Blood Cells 20.8 (H) 4.5 - 11.0 K/UL    RBC 3.78 (L) 4.4 - 5.5 M/UL    Hemoglobin 11.5 (L) 13.5 - 16.5 GM/DL    Hematocrit 16.1 (L) 40 - 50 %    MCV 91.0 80 - 100 FL    MCH 30.6 26 - 34 PG    MCHC 33.6 32.0 - 36.0 G/DL  RDW 15.2 (H) 11 - 15 %    Platelet Count 245 150 - 400 K/UL    MPV 9.6 7 - 11 FL   BASIC METABOLIC PANEL    Collection Time: 07/15/17  2:00 AM   Result Value Ref Range    Sodium 134 (L) 137 - 147 MMOL/L    Potassium 4.6 3.5 - 5.1 MMOL/L    Chloride 106 98 - 110 MMOL/L    CO2 18 (L) 21 - 30 MMOL/L    Anion Gap 10 3 - 12    Glucose 154 (H) 70 - 100 MG/DL    Blood Urea Nitrogen 17 7 - 25 MG/DL    Creatinine 6.96 0.4 - 1.24 MG/DL    Calcium 8.2 (L) 8.5 - 10.6 MG/DL    eGFR Non African American >60 >60 mL/min    eGFR African American >60 >60 mL/min   O2 SATURATION, MIXED VENOUS    Collection Time: 07/15/17  2:00 AM   Result Value Ref Range    O2Sat-Mixed Venous 55.3 %   POC GLUCOSE    Collection Time: 07/15/17  4:09 AM   Result Value Ref Range    Glucose, POC 151 (H) 70 - 100 MG/DL   POC GLUCOSE    Collection Time: 07/15/17  5:53 AM   Result Value Ref Range    Glucose, POC 169 (H) 70 - 100 MG/DL   POC GLUCOSE    Collection Time: 07/15/17  6:59 AM   Result Value Ref Range    Glucose, POC 174 (H) 70 - 100 MG/DL   O2 SATURATION, MIXED VENOUS    Collection Time: 07/15/17  7:00 AM   Result Value Ref Range    O2Sat-Mixed Venous 48.3 %   POC GLUCOSE    Collection Time: 07/15/17  8:13 AM   Result Value Ref Range    Glucose, POC 174 (H) 70 - 100 MG/DL   O2 SATURATION, MIXED VENOUS    Collection Time: 07/15/17  9:20 AM   Result Value Ref Range O2Sat-Mixed Venous 46.1 %   POC GLUCOSE    Collection Time: 07/15/17  9:59 AM   Result Value Ref Range    Glucose, POC 175 (H) 70 - 100 MG/DL   POC GLUCOSE    Collection Time: 07/15/17 10:59 AM   Result Value Ref Range    Glucose, POC 203 (H) 70 - 100 MG/DL   POC GLUCOSE    Collection Time: 07/15/17 12:12 PM   Result Value Ref Range    Glucose, POC 177 (H) 70 - 100 MG/DL   O2 SATURATION, CENTRAL VENOUS    Collection Time: 07/15/17 12:15 PM   Result Value Ref Range    O2 Sat, Central Venous 35.0 %   O2 SATURATION, CENTRAL VENOUS    Collection Time: 07/15/17  3:50 PM   Result Value Ref Range    O2 Sat, Central Venous 59.6 %       Point of Care Testing  (Last 24 hours)  Glucose: (!) 154 (07/15/17 0200)  POC Glucose (Download): (!) 177 (07/15/17 1212)    Radiology and other Diagnostics Review:    Pertinent radiology reviewed.

## 2017-07-15 NOTE — Progress Notes
Patient UO 12 at 0100 and 22 at 0200. Index taken and labs drawn and sent with mixed veneous. Notified Wilhemina CashKelly B, NP and will wait till 0300 to see what labs come back at and what UO is.      07/15/17 0200 07/15/17 0205   Basic Hemodynamic Monitoring   Device Type Standard PA Catheter Standard PA Catheter   CO 4.88 (!) 3.99   CO Method Indirect Fick Calculation Bolus   CI (!) 1.92 (!) 1.56   SV (Calculated) 64 (!) 52.5   SVI (Calculated) (!) 25 (!) 20.6   CVP (!) 13 MM HG (!) 13 MM HG   SVR (Calculated) 1087 1363   SVRI (Calculated) (!) 2750 (!) 3475   Advanced Hemodynamic Monitoring   PA Pressure (!) 36/19 (!) 34/18   PA Mean (mm Hg) (!) 25 mm Hg (!) 24 mm Hg   PAOP --  (!) 18 MM HG   PVR (Calculated) --  120   PVRI (Calculated) --  (!) 307   LVSWI (Calculated) --  (!) 17.6   RVSWI (Calculated) (!) 4 (!) 3.08     Will continue to monitor.

## 2017-07-15 NOTE — Progress Notes
Patient resting in bed with no major changes noted from previous assessment. Dangled and stood at the bedside at 2200 with assist x2, wearing home CPAP and tolerating clear liquids. UO only 25 mL for 0000 and C.I. <2 on both Fick calculation and thermodilution. Notified Tresa EndoKelly B, NP of findings and no new orders at this time.     Drips:   NS: 30  Levo: 0.02  Insulin: 2       07/15/17 0000 07/15/17 0003   Basic Hemodynamic Monitoring   Device Type Standard PA Catheter Standard PA Catheter   CO 5.01 4.09   CO Method Indirect Fick Calculation Bolus   CI (!) 1.96 (!) 1.6   SV (Calculated) 66 (!) 54.5   SVI (Calculated) (!) 26 (!) 21.4   CVP (!) 14 MM HG (!) 15 MM HG   SVR (Calculated) 910 1056   SVRI (Calculated) 2367 (!) 2692   Advanced Hemodynamic Monitoring   PA Pressure (!) 34/17 (!) 34/18   PA Mean (mm Hg) (!) 24 mm Hg (!) 24 mm Hg   PAOP --  (!) 17 MM HG   PVR (Calculated) --  137   PVRI (Calculated) --  (!) 349   LVSWI (Calculated) --  (!) 15.1   RVSWI (Calculated) (!) 4 (!) 2.62     Will continue to monitor.

## 2017-07-15 NOTE — Progress Notes
At 0300 UO 70 and at 0400 UO 25. Notified Tresa EndoKelly B, NP and no new orders at this time. Patient continues having some c/o pain and prn meds administered. Wearing home CPAP with O2 sats > 96%.    Drips:  Levo: 0.03  NS: 30  Insulin: 2    Will continue to monitor.

## 2017-07-15 NOTE — Case Management (ED)
Case Management Progress Note    NAME:Gregory Acosta                          MRN: [id number]              DOB:[birth year]          AGE: 61 y.o.  ADMISSION DATE: 07/10/2017             DAYS ADMITTED: LOS: 5 days      Todays Date: 07/15/2017    Plan  POD 1- CABG- continue to wean vasoactive gtts as able, continue to monitor renal status, continue to mobilize, continue current inpt care.     Interventions  ? Support   Support: Pt/Family Updates re:POC or DC Plan, Patient Education  ? Info or Referral      ? Discharge Planning   EMR and POC reviewed   Met w pt/spouse at the bedside to introduce self and role in dc planning.     Patient comfortable with post op recovery and discussed importance of increasing activity, monitoring incisions, s/s of infection, monitor bowel function, pain control and encouraged calling for questions/concerns.  Role of HH reviewed- pt and spouse would like to see how pt progresses before deciding upon services and appreciate CM team's follow up w this in a day or so.    NCM and pt agreed to follow up tomorrow if team feels he may need dc this weekend otherwise will f/u Monday 07/23.     ? Medication Needs      ? Financial      ? Legal   Legal: [place]  ? Other        Disposition  ? Expected Discharge Date    Expected Discharge Date: 07/19/17  ? Transportation   Does the patient need discharge transport arranged?: No  Transportation Name, Phone and Availability #1: [name] (wife) [phone]  Does the patient use Medicaid Transportation?: No  ? Discharge Disposition   Final dc plan TBD- home w self care/spousal assist vs home w [facility] pending progression.     [name] BSN, RN  Integrated Nurse Case Manager  Inpatient Cardiothoracic Surgery   M-F [phone]  O: [phone]  Pg: [phone]

## 2017-07-15 NOTE — Progress Notes
Pt assessment completed, see doc flow sheet for details.  Pt neurologically intact, pain meds as ordered.  SR, norepinephrine gtt, received albumin bolus.  3LNC, denies SOA.  Denies N/V/D, refuses food this AM, hypoactive BS x4.  Foley cath to DD, clear dark yellow drainage.  In chair.  Med CT x2 -20cm, SS drainage.  Will monitor.

## 2017-07-15 NOTE — Progress Notes
Cardiothoracic Surgery Critical Care   Progress Note     Gregory Acosta  Today's Date:  07/15/2017  Admission Date: 07/10/2017  LOS: 5 days    Procedure: BYPASS GRAFT CORONARY ARTERYx4 , LIMA, LEVH:   POD#: 1    Principal Problem:    Unstable angina (HCC)  Active Problems:    Type 2 diabetes mellitus without complication, without long-term current use of insulin (HCC)    Essential hypertension    Mixed hyperlipidemia    Obstructive sleep apnea syndrome    Obesity    Coronary artery disease involving native coronary artery of native heart    Chronic diastolic CHF (congestive heart failure), NYHA class 2 (HCC)    Vasogenic shock (HCC)        Assessment/Plan:      Neuro ??? Pain controlled on current regimen. Continue PRN Fentanyl, oxycodone with scheduled Tylenol.   CV ??? SR, rates 70s-80s. On/off low dose NE overnight to maintain MAP > 70, currently off NE. CI 1.96 by Fick calculation, SvO2 46. Continue to monitor hemodynamic stability, rhythm changes, titrate vasoactive infusions. Cont ASA 81 mg (hold for plts <80k) and statin. Hold BB due to labile BP, hold ACEI in post op phase. Intra op TEE LVEF 50%.  Resp ??? Daily CXR ??? bedside interpretation: lungs expanded, bilateral small pleural effusions with atelectasis; will review radiology report. SpO2 98% on 3L NC. Wean O2. Cont IS, aggressive pulm toilet. Chest tubes plan: monitor output with increased activity, will remove if output remains low.   Renal ??? Monitor BMP, assess for AKI. sCR 0.85. UOP 2.1 L/24hr, net +1.6 L/24hr. Discontinue foley catheter when hemodynamically stable and appropriate.   GI - ADAT, last BM 7/17, continue post op bowel regimen.   ID ??? Afebrile. Continue standard post op antibiotic for prophylaxis, per CTS protocol.   Heme ??? Hgb 11.5, continue to monitor acute blood loss anemia. Hold DVT prophylaxis until cleared by CTS, continue mechanical prophylaxis.   FEN ??? If creatinine < 2.0, replace Mg and K per CTS post op protocol. A1c 10.9%. Insulin gtt per Modified [facility] Protocol. Endocrinology following, agree with plan to add SQ insulin.     Activity ??? Pt up to chair today, should ambulate in halls with nursing and cardiac rehab. Early cardiac PT/OT.     Prophylaxis Review:  Lines:  Yes; Arterial Line; Indication:  Continuous BP monitoring; Location:  Radial  Central Line; Indication:  Med not deliverable peripherally and Hemodynamic monitoring; Type:  Internal jugular  Antibiotic Usage:  No  VTE:  Mechanical prophylaxis; Foot pump  Urinary Catheter: Yes; Retain foley due to:  Need for accurate Intake and Output    Disposition:   Plan as above. Continue ICU care while pt critically ill s/p CABG with vasoplegia.      I have seen, personally fully evaluated, and discussed patient with the critical care attending and cardiothoracic surgeon. The patient is critically ill and at risk for life threatening deterioration. I spent 60 minutes (excluding time spent performing or supervising any procedures) providing and personally directing critical care services including hemodynamic monitoring and management, lab and radiology review, medication review and management, fluid and electrolyte management and coordination of care.       Rayne Du, APRN   [facility] Intensive Care  Pager [phone]  07/15/2017    Subjective:       HPI:   Gregory Acosta is a 61 y.o. male with a history of obesity, uncontrolled DM (A1C 10.5),  HTN, HLD, and OSA.  He presented to the ED on 7/14 with chest heaviness and pressure along with exertional SOA.  His cath results were positive for multivessel disease.  He is now s/p CABG with Dr. Elias Else. He has required intermittent BP support with NE.      REVIEW OF SYSTEMS:   Constitutional: positive for fatigue, negative for fevers and chills  Eyes: negative for visual disturbance  Respiratory: positive for cough, negative for increased work of breathing or wheezing  Cardiovascular: negative for chest pain, palpitations, lower extremity edema Gastrointestinal: negative for nausea, vomiting and abdominal pain  Neurological: positive for LUE paresthesia, negative for headaches and dizziness    Long Term Goal:  deferred    Objective:        Medications:  Scheduled Meds:    acetaminophen (TYLENOL) tablet 1,000 mg 1,000 mg Oral Q6H*   aspirin chewable tablet 81 mg 81 mg Oral QDAY   ezetimibe (ZETIA) tablet 10 mg 10 mg Oral QDAY   insulin aspart U-100 (NOVOLOG FLEXPEN) injection PEN 0-14 Units 0-14 Units Subcutaneous ACHS   insulin aspart U-100 (NOVOLOG FLEXPEN) injection PEN 10 Units 10 Units Subcutaneous TID after meals   insulin glargine (LANTUS SOLOSTAR, BASAGLAR) injection PEN 50 Units 50 Units Subcutaneous QDAY(12)   pramipexole (MIRAPEX) tablet 0.25 mg 0.25 mg Oral QHS   rosuvastatin (CRESTOR) tablet 10 mg 10 mg Oral Once per day on Mon Thu   senna/docusate (SENOKOT-S) tablet 2 tablet 2 tablet Oral BID   Continuous Infusions:  ??? DOBUTamine (DOBUTREX) 1 g/D5W 250 mL infusion (std conc)(premade)     ??? insulin regular (NOVOLIN R) 100 Units in sodium chloride 0.9% (NS) 100 mL IV drip (std conc) 5 Units/hr (07/15/17 1001)   ??? norepinephrine (LEVOPHED) 4 mg in dextrose 5% (D5W) 250 mL IV drip (std conc) Stopped (07/15/17 0800)   ??? sodium chloride 0.9 %   infusion 20 mL/hr at 07/15/17 1031     PRN and Respiratory Meds:[START ON 07/19/2017] acetaminophen Q6H PRN **OR** [START ON 07/19/2017] acetaminophen Q6H PRN, albuterol Q4H PRN, alum/mag hydroxide/simeth Q4H PRN, bisacodyl QDAY PRN, fentaNYL citrate PF Q1H PRN, hydrALAZINE Q6H PRN, lidocaine PF PRN, milk of magnesia (CONC) QDAY PRN, ondansetron Q6H PRN **OR** ondansetron (ZOFRAN) IV Q6H PRN, oxyCODONE Q3H PRN, potassium chloride SR PRN **OR** potassium chloride PRN **OR** potassium chloride in water PRN                       Vital Signs: Last Filed                  Vital Signs: 24 Hour Range   BP: 117/72 (07/19 0400)  Temp: 36.9 ???C (98.4 ???F) (07/19 0920)  Pulse: 82 (07/19 1200) Respirations: 16 PER MINUTE (07/19 1200)  SpO2: 98 % (07/19 1200)  O2 Delivery: Nasal Cannula (07/19 1200)  SpO2 Pulse: 84 (07/19 1200)  Height: 182.9 cm (72.01) (07/19 0600) BP: (89-123)/(58-76)   ABP: (84-134)/(47-86)   Temp:  [36.7 ???C (98.1 ???F)-37.5 ???C (99.5 ???F)]   Pulse:  [74-89]   Respirations:  [10 PER MINUTE-32 PER MINUTE]   SpO2:  [92 %-100 %]   O2 Delivery: Nasal Cannula   Intensity Pain Scale 0-10 (Pain 1): 6 (07/15/17 1100) Vitals:    07/12/17 0822 07/13/17 0600 07/15/17 0600   Weight: (!) 137.9 kg (304 lb) (!) 138.6 kg (305 lb 8.9 oz) 134.9 kg (297 lb 6.4 oz)  Intake/Output Summary:  (Last 24 hours)    Intake/Output Summary (Last 24 hours) at 07/15/17 1228  Last data filed at 07/15/17 1200   Gross per 24 hour   Intake          3782.31 ml   Output             1934 ml   Net          1848.31 ml         Physical Exam:         Neuro: A&O x 4, MAE =  Cardiovascular: RRR no rub or murmur  Respiratory: LS CTA bil - diminished in the bases  GI: soft, NT, hypoactive BS  Extremities: No Edema  Incisions: Sternal incision dressing, dry and intact. No crepitus or sternal instability.      Chest Tubes OUTPUT/24 HOURS AIR LEAK PRESENT   Mediastinal no     Pertinent Meds:               Taking    Reason for Not Taking  1. Aspirin yes    2. B-Blocker no Labile BP   3. Statin yes    4. ACE/ARB       Beta blocker held due to labile blood pressure.    Artificial airway:  None                                                                                         Vent weaning trial:  Not applicable    Laboratory:  LABS:  Recent Labs      07/13/17   0320  07/14/17   0300  07/14/17   1349  07/14/17   2000  07/15/17   0200   NA  136*  136*  136*   --   134*   K  4.4  3.6  4.5  4.6  4.6   CL  106  105  107   --   106   CO2  20*  23  22   --   18*   GAP  10  8  7    --   10   BUN  14  15  14    --   17   CR  0.96  1.00  0.96   --   0.85   GLU  127*  237*  130*   --   154*   CA  9.1  8.6  8.2*   --   8.2* ALBUMIN  3.8  3.2*   --    --    --    MG  2.6  2.2  2.8*  3.3*   --        Recent Labs      07/12/17   1515  07/12/17   2120  07/13/17   0320  07/13/17   1000  07/13/17   1220  07/13/17   2000  07/14/17   0300  07/14/17   1349  07/15/17   0200   WBC   --    --   16.7*   --    --    --  15.2*  22.1*  20.8*   HGB   --    --   12.7*   --    --    --   11.2*  10.6*  11.5*   HCT   --    --   37.8*   --    --    --   33.4*  31.6*  34.4*   PLTCT   --    --   266   --    --    --   227  183  245   INR   --    --    --   1.1   --    --    --   1.2   --    PTT  86.5*  58.7*  139.2*   --   71.7*  80.0*  83.4*  28.8   --    AST   --    --   31   --    --    --   32   --    --    ALT   --    --   39   --    --    --   38   --    --    ALKPHOS   --    --   52   --    --    --   52   --    --       Estimated Creatinine Clearance: 129.7 mL/min (based on SCr of 0.85 mg/dL).  Vitals:    07/12/17 0822 07/13/17 0600 07/15/17 0600   Weight: (!) 137.9 kg (304 lb) (!) 138.6 kg (305 lb 8.9 oz) 134.9 kg (297 lb 6.4 oz)      Recent Labs      07/14/17   1834   PHART  7.41   PO2ART  79*           Radiology and Other Diagnostic Procedures Review:    Reviewed

## 2017-07-15 NOTE — Progress Notes
Critical Care Progress Note          Today's Date:  07/15/2017  Name:  Gregory Acosta                       MRN:  [id number]   Admission Date: 07/10/2017  LOS: 5 days                     Assessment/Plan:   Principal Problem:    Unstable angina (HCC)  Active Problems:    Type 2 diabetes mellitus without complication, without long-term current use of insulin (HCC)    Essential hypertension    Mixed hyperlipidemia    Obstructive sleep apnea syndrome    Obesity    Coronary artery disease involving native coronary artery of native heart    Chronic diastolic CHF (congestive heart failure), NYHA class 2 (HCC)        61 yo male with OSA on CPAP, spinal stenosis, uncontrolled IDDM (HbA1c = 10.9), PAD, COPD, LVH  ???  Neuro: Prn pain meds; prn Xanax, PTA Mirapex  Cardiac: bASA, Crestor, Metoprolol on hold with just coming off Levo; Recheck SVO2 which was 46%; may need DBA  Pulmonary: I/S, OOB  FEN: AAT  ID:  periop abx  Renal:  stable  Heme:  stable  Endo:  Modified [facility] Insulin Protocol, Appreciate endocrine consult  Prophylaxis: HOB>40, PPI, DVT prophylaxis per primary team  Disposition/Family: Needs ICU with low SVo2  __________________________________________________________________________________  Subjective:  [name] is a 61 y.o. male.  Overnight Events: Significant event:   extubated.  Patient low SvO2  Objective:  Medications:  Scheduled Meds:  acetaminophen (TYLENOL) tablet 1,000 mg 1,000 mg Oral Q6H*   aspirin chewable tablet 81 mg 81 mg Oral QDAY   ceFAZolin (ANCEF) IVP 2 g 2 g Intravenous Q8H*   ezetimibe (ZETIA) tablet 10 mg 10 mg Oral QDAY   insulin aspart U-100 (NOVOLOG FLEXPEN) injection PEN 10 Units 10 Units Subcutaneous TID after meals   pramipexole (MIRAPEX) tablet 0.25 mg 0.25 mg Oral QHS   rosuvastatin (CRESTOR) tablet 10 mg 10 mg Oral Once per day on Mon Thu   senna/docusate (SENOKOT-S) tablet 2 tablet 2 tablet Oral BID   Continuous Infusions: ??? DOBUTamine (DOBUTREX) 1 g/D5W 250 mL infusion (std conc)(premade)     ??? insulin regular (NOVOLIN R) 100 Units in sodium chloride 0.9% (NS) 100 mL IV drip (std conc) 5 Units/hr (07/15/17 1001)   ??? norepinephrine (LEVOPHED) 4 mg in dextrose 5% (D5W) 250 mL IV drip (std conc) Stopped (07/15/17 0800)   ??? sodium chloride 0.9 %   infusion 30 mL/hr at 07/15/17 0724     PRN and Respiratory Meds:[START ON 07/19/2017] acetaminophen Q6H PRN **OR** [START ON 07/19/2017] acetaminophen Q6H PRN, albuterol Q4H PRN, alum/mag hydroxide/simeth Q4H PRN, bisacodyl QDAY PRN, fentaNYL citrate PF Q1H PRN, hydrALAZINE Q6H PRN, lidocaine PF PRN, milk of magnesia (CONC) QDAY PRN, ondansetron Q6H PRN **OR** ondansetron (ZOFRAN) IV Q6H PRN, oxyCODONE Q3H PRN, potassium chloride SR PRN **OR** potassium chloride PRN **OR** potassium chloride in water PRN                     Vital Signs: Last Filed                  Vital Signs: 24 Hour Range   BP: 117/72 (07/19 0400)  ABP: 134/65 (07/19 0920)  Temp: 36.9 ???C (98.4 ???F) (07/19 0920)  Pulse: 80 (07/19  0920)  Respirations: 17 PER MINUTE (07/19 0920)  SpO2: 96 % (07/19 0920)  O2 Delivery: Nasal Cannula (07/19 0900)  Height: 182.9 cm (72.01) (07/19 0600)  Weight: 134.9 kg (297 lb 6.4 oz) (07/19 0600)  BP: (89-123)/(58-76)   ABP: (84-134)/(47-86)   Temp:  [36.7 ???C (98.1 ???F)-37.5 ???C (99.5 ???F)]   Pulse:  [74-89]   Respirations:  [10 PER MINUTE-32 PER MINUTE]   SpO2:  [92 %-100 %]   O2 Delivery: Nasal Cannula    Intensity Pain Scale 0-10 (Pain 1): (not recorded) Vitals:    07/12/17 0822 07/13/17 0600 07/15/17 0600   Weight: (!) 137.9 kg (304 lb) (!) 138.6 kg (305 lb 8.9 oz) 134.9 kg (297 lb 6.4 oz)       Critical Care Vitals:  Vig[place]o  SVI (Calculated): (!) 20 (07/15/17 0920)  SVRI (Calculated): (!) 3900 (07/15/17 0920)  LVSWI (Calculated): (!) 21.5 (07/15/17 0806)   ICP Monitoring:     PA  Catheter:  PA Catheter Only  PA Pressure: (!) 31/16 (07/15/17 0920)  PA Mean (mm Hg): (!) 22 mm Hg (07/15/17 0920) PAOP: (!) 17 MM HG (07/15/17 0806)  CO: 4.08 (07/15/17 0920)  CO Method: Indirect Fick Calculation (07/15/17 0920)  CI: (!) 1.6 (07/15/17 0920)  SVR (Calculated): 1529 (07/15/17 0920)  SVRI (Calculated): (!) 3900 (07/15/17 0920)  PVR (Calculated): 93 (07/15/17 0806)  PVRI (Calculated): (!) 237 (07/15/17 0806)  SV (Calculated): (!) 51 (07/15/17 0920)  SVI (Calculated): (!) 20 (07/15/17 0920)  Hemodynamics/Oxycalcs:  Hemodynamics/Oxycalcs  Device Type: Standard PA Catheter (07/15/17 0920)  PA Pressure: (!) 31/16 (07/15/17 0920)  PA Mean (mm Hg): (!) 22 mm Hg (07/15/17 0920)  CVP: (!) 10 MM HG (07/15/17 0920)  PAOP: (!) 17 MM HG (07/15/17 0806)  CO: 4.08 (07/15/17 0920)  CO Method: Indirect Fick Calculation (07/15/17 0920)  CI: (!) 1.6 (07/15/17 0920)  SVR (Calculated): 1529 (07/15/17 0920)  SVRI (Calculated): (!) 3900 (07/15/17 0920)  PVR (Calculated): 93 (07/15/17 0806)  PVRI (Calculated): (!) 237 (07/15/17 0806)  LVSWI (Calculated): (!) 21.5 (07/15/17 0806)  RVSWI (Calculated): (!) 3 (07/15/17 0920)  SV (Calculated): (!) 51 (07/15/17 0920)  SVI (Calculated): (!) 20 (07/15/17 0920)    Intake/Output Summary:  (Last 24 hours)    Intake/Output Summary (Last 24 hours) at 07/15/17 1007  Last data filed at 07/15/17 1000   Gross per 24 hour   Intake          3728.33 ml   Output             2262 ml   Net          1466.33 ml         Physical Exam:  General:  no distress, appears stated age  Lungs:  Clear to auscultation bilaterally  CV: RRR  Abdomen:  Soft, non-tender.  Bowel sounds normal.  No masses.  No organomegaly.  Extremities:  Extremities normal, atraumatic, no cyanosis or edema  Neurologic:  CNII - XII intact.  PERRL      Prophylaxis Review:  Lines:  Yes; Arterial Line; Indication:  Frequent blood draws and Continuous BP monitoring; Location:  Radial  Central Line; Indication:  Hemodynamic monitoring; Type:  Internal jugular  Urinary Catheter:  Yes; Retain foley due to:  Need for accurate Intake and Output Antibiotic Usage:  No  VTE: none    Lab Review:  Pertinent labs reviewed  Point of Care Testing:  (Last 24 hours):  Glucose: (!) 154 (07/15/17 0200)  POC  Glucose (Download): (!) 174 (07/15/17 0813)    Radiology and Other Diagnostic Procedures Review:    Pertinent radiology reviewed.    I have seen, examined and reviewed data concerning this patient.  I discussed the findings and plan of care with the ICU team. I spent 45 minutes in critical care time, excluding procedures today.    Georgiann Mohs, MD  Associate Professor  Anesthesiology and Critical Care  [phone]

## 2017-07-15 NOTE — Progress Notes
Received report from Gregory Ferrariaylor C, RN and assumed care of pt. Assessment of pt by systems:    Neuro: afebrile, PERRLA-3, MAE equal and strong with some numbness and tingling to LUE. AXOx4  Lungs: lung sounds clear over diminished on 3L n/Acosta  Cardiac: NSR in the 70's-80's, MAP goal >65, trace edema to BLLE noted, AV pacing wires on VVI mode  GI/GU: F/Acosta with clear yellow UO, no Acosta/o n/v and tolerating clear liquids  Skin/Drains: 2 CT's Y'd together to -20 suction with sanguinous drainage noted    Drips:  Insulin: 3 units/hr  Levo: 0.02 mcg/kg/min  NS: 30 mL/hr    Dayshift had some differences with CI and CO and Q4hr ficks and thermodilution being done.      07/14/17 2000 07/14/17 2008   Basic Hemodynamic Monitoring   Device Type --  Standard PA Catheter   CO 5.73 5.21   CO Method Indirect Fick Calculation Bolus   CI (!) 2.24 (!) 2.04   SV (Calculated) 67 60.6   SVI (Calculated) (!) 26 (!) 23.8   CVP (!) 13 MM HG (!) 13 MM HG   SVR (Calculated) 987 1105   SVRI (Calculated) (!) 2500 (!) 2818   Advanced Hemodynamic Monitoring   PA Pressure (!) 38/17 (!) 39/18   PA Mean (mm Hg) (!) 25 mm Hg (!) 26 mm Hg   PAOP --  (!) 18 MM HG   PVR (Calculated) --  123   PVRI (Calculated) --  (!) 313   LVSWI (Calculated) --  (!) 21.6   RVSWI (Calculated) (!) 4 (!) 4.2       See O2 for full assessment and will continue to monitor.

## 2017-07-15 NOTE — Progress Notes
NAE ON.  Some pain this morning but says medications do help.  CI>2 overnight by Fick calculation; SvO2s consistently in upper 50s.  UOP low but adequate and Cr trending down.  Plan to d/c Swan and chest tubes after mobilization.  Endocrine c/s for DM management.  Monitor Cr and UOP; anticipate transfer to floor later today if remains stable.      Oneal GroutAshley Kasem Mozer MD

## 2017-07-16 ENCOUNTER — Inpatient Hospital Stay: Admit: 2017-07-16 | Discharge: 2017-07-16 | Payer: BC Managed Care – PPO

## 2017-07-16 ENCOUNTER — Encounter: Admit: 2017-07-16 | Discharge: 2017-07-16 | Payer: BC Managed Care – PPO

## 2017-07-16 DIAGNOSIS — M4800 Spinal stenosis, site unspecified: ICD-10-CM

## 2017-07-16 DIAGNOSIS — R0789 Other chest pain: ICD-10-CM

## 2017-07-16 DIAGNOSIS — R079 Chest pain, unspecified: ICD-10-CM

## 2017-07-16 DIAGNOSIS — I517 Cardiomegaly: ICD-10-CM

## 2017-07-16 DIAGNOSIS — E782 Mixed hyperlipidemia: ICD-10-CM

## 2017-07-16 DIAGNOSIS — Z8249 Family history of ischemic heart disease and other diseases of the circulatory system: ICD-10-CM

## 2017-07-16 DIAGNOSIS — G4733 Obstructive sleep apnea (adult) (pediatric): ICD-10-CM

## 2017-07-16 DIAGNOSIS — I10 Essential (primary) hypertension: ICD-10-CM

## 2017-07-16 DIAGNOSIS — G2581 Restless legs syndrome: ICD-10-CM

## 2017-07-16 DIAGNOSIS — I739 Peripheral vascular disease, unspecified: ICD-10-CM

## 2017-07-16 DIAGNOSIS — E119 Type 2 diabetes mellitus without complications: Principal | ICD-10-CM

## 2017-07-16 LAB — CBC: Lab: 21 K/UL — ABNORMAL HIGH (ref >60)

## 2017-07-16 LAB — BLOOD GASES, CENTRAL VENOUS
Lab: 19 MMOL/L
Lab: 29 mmHg — ABNORMAL LOW (ref 40–50)
Lab: 35 mmHg — ABNORMAL LOW (ref >40)
Lab: 4.4 MMOL/L
Lab: 47 % — ABNORMAL LOW (ref 65–75)
Lab: 7.3 (ref 7.30–7.40)

## 2017-07-16 LAB — BASIC METABOLIC PANEL: Lab: 130 MMOL/L — ABNORMAL LOW (ref >60)

## 2017-07-16 LAB — O2 SATURATION, CENTRAL VENOUS: Lab: 47 % — ABNORMAL LOW (ref 3.5–5.1)

## 2017-07-16 LAB — POC GLUCOSE
Lab: 271 mg/dL — ABNORMAL HIGH (ref 70–100)
Lab: 269 mg/dL — ABNORMAL HIGH (ref 70–100)
Lab: 300 mg/dL — ABNORMAL HIGH (ref 70–100)
Lab: 275 mg/dL — ABNORMAL HIGH (ref 70–100)
Lab: 271 mg/dL — ABNORMAL HIGH (ref 70–100)

## 2017-07-16 MED ORDER — INSULIN ASPART 100 UNIT/ML SC FLEXPEN
12 [IU] | Freq: Three times a day (TID) | SUBCUTANEOUS | 0 refills | Status: DC
Start: 2017-07-16 — End: 2017-07-17

## 2017-07-16 MED ORDER — INSULIN GLARGINE 100 UNIT/ML (3 ML) SC INJ PEN
55 [IU] | Freq: Every day | SUBCUTANEOUS | 0 refills | Status: DC
Start: 2017-07-16 — End: 2017-07-17

## 2017-07-16 MED ORDER — METOPROLOL TARTRATE 25 MG PO TAB
12.5 mg | Freq: Two times a day (BID) | ORAL | 0 refills | Status: DC
Start: 2017-07-16 — End: 2017-07-17
  Administered 2017-07-16 – 2017-07-17 (×2): 12.5 mg via ORAL

## 2017-07-16 MED ORDER — METOPROLOL TARTRATE 25 MG PO TAB
25 mg | Freq: Two times a day (BID) | ORAL | 0 refills | Status: DC
Start: 2017-07-16 — End: 2017-07-16

## 2017-07-16 NOTE — Progress Notes
2300-Initial assessment complete. Pt AOx4. MAE. Afebrile. PERRL. Pt is SR-ST in the 90s-100s. MAP >65, SBP 140-160. Pulses palpable. Trace edema in BLE. Lungs clear/diminished on 2L per BiPAP. Adequate UOP per foley. Bowel sounds present. Skin and wounds c/d/i. Minimal SS drainage from CT. Some c/o pain, treating appropriately per MAR, pain seems to be under control at this time. Will continue to monitor.     Gtts:    Dobutamine @ 1  NS @ 30

## 2017-07-16 NOTE — Progress Notes
Pt assessment completed, see doc flow sheet for details.  Pt neurologically intact, pain meds as ordered.  SR - ST, Dobutamine gtt infusing at 112mcg/kg for low mixed venous. Pt slightly hypertensive, arterial line reading approx 20 points higher than cuff pressure. Hydralazine administered per order. CPAP on per pt. Request. Lungs clear/diminished. Denies N/V/D, reports flatus with active bowel sounds. Foley cath to DD, clear light yellow drainage. Med CT x2 -20cm, SS drainage. Will monitor.

## 2017-07-16 NOTE — Progress Notes
Pt still with SBP 160s-170s per art line and 140s-150s per cuff. Waylan RocherKelly Baker APRN notified, no new orders at this time.

## 2017-07-16 NOTE — Other
Critical result or procedure called (document test and value, and read back): slight R apical pneumothorax   Time MD/NP Notified:  0910  MD/NP Name: Christean LeafS. Clausen   MD/NP Response/Orders Given:  Proceed w/ current plan of care.

## 2017-07-16 NOTE — Progress Notes
Assumed care. Assessment performed. Pt sitting comfortably in chair. Pain controlled at this time. Afebrile. PERRL. Pt states numbness in L hand and fingers. States improvement postop. HR SR 90s, SBP ~150-160s, pulses palpable, +1 pitting edema biLE.   Lungs clear/diminished, transitioned to RA. Performed IS independently.   UOP adequate, foley catheter d/c'd per orders. Clear/yellow.   Minimal CT output observed, SS drainage -20 suction.  Incisions c/d/i.   gtts: dobutamine.

## 2017-07-16 NOTE — Progress Notes
2131 Pt remains hypertensive despite Hydralazine admin. K Baker, APRN updated, orders to cut Dobutamine in 1/2. Dobutamine now infusing at 61mcg/kg/min.

## 2017-07-16 NOTE — Progress Notes
Patient arrived to room # [phone](HC403) via wheelchair accompanied by RN. Patient transferred to the chair without assistance. Bedside safety checks completed. Initial patient assessment completed, refer to flowsheet for details. Admission skin assessment completed by:     Pressure Injury Present on Hospital Admission (within 24 hours): No    1. Occiput: No  2. Ear: No  3. Scapula: No  4. Spinous Process: No  5. Shoulder: No  6. Elbow: No  7. Iliac Crest: No  8. Sacrum/Coccyx: No  9. Ischial Tuberosity: No  10. Trochanter: No  11. Knee: No  12. Malleolus: No  13. Heel: No  14. Toes: No  15. Assessed for device associated injury Yes  16. Nursing Nutrition Assessment Completed No    See Doc Flowsheet for additional wound details.     INTERVENTIONS:

## 2017-07-16 NOTE — Progress Notes
PHYSICAL THERAPY  ASSESSMENT     MOBILITY:  Progressive Mobility Level: Walk in hallway  Distance Walked (feet): 125 ft  Level of Assistance: Stand by assistance  Assistive Device: Walker  Time Tolerated: 11-30 minutes    SUBJECTIVE:  Significant hospital events: s/p BYPASS GRAFT CORONARY ARTERYx4  (7/19)  Mental / Cognitive Status: Alert;Oriented;Cooperative  Persons Present: Family  Pain: Patient complains of pain  Pain Location: Post-surgical  Pain Interventions: Patient pre-medicated  Precautions: Sternal Precautions  Ambulation Assist: Independent Mobility I  n Community without Device  Patient Owned Equipment: None  Home Situation: Lives with Family;Has Assistance Available as Needed  Type of Home: House  Entry Stairs: 1-2 Stairs  In-Home Stairs: No Stairs    BED MOBILITY/TRANSFERS:  Up to chair at start of session.  Bed mobility not assessed, but discussed.    Transfer Type: Sit to Stand  Transfer: Assistance Level: To/From;Bed Side Chair (Contact guard assist)  Transfer: Assistive Device: None    GAIT:  Gait Distance: 125 feet  Gait: Assistance Level: Minimal Assist (without use of roller walker, contact guard assist with use )  Gait: Assistive Device: None (the decision made to ambulate rest of distance with it.)    Patient gait more steady and efficient with use of roller walker at this time. The patient notes anxiety related to attempts at walking without its use.    EDUCATION:  Persons Educated: Patient  Patient Barriers To Learning: None Noted  Interventions: Repetition of Instructions  Teaching Methods: Verbal Instruction  Patient Response: Verbalized Understanding  Topics: Plan/Goals of PT Interventions;Use of Assistive Device/Orthosis;Ambulate With Nursing;Up with Assist Only;Safety Awareness;Precautions    ASSESSMENT/PROGRESS:  Assessment/ProgressAssessment/Progress: Should Improve w/ Continued PT    AM-PAC 6 Clicks Basic Mobility Inpatient Turning from your back to your side while in a flat bed without using bed rails: A Little  Moving from lying on your back to sitting on the side of a flatbed without using bedrails : A Little  Moving to and from a bed to a chair (including a wheelchair): A Little  Standing up from a chair using your arms (e.g. wheelchair, or bedside chair): A Little  To walk in hospital room: A Little  Climbing 3-5 steps with a railing: A Little  Raw Score: 18  Standardized (T-scale) Score: 41.05  Basic Mobility CMS 0-100%: 40.47  CMS G Code Modifier for Basic Mobility: CK    GOALS:  Goals  Goal Formulation: With Patient  Time For Goal Achievement: 1 day, To, 5 days  Pt Will Go Supine To/From Sit: w/ Stand By Assist  Pt Will Transfer Sit to Stand: w/ Stand By Assist  Pt Will Ambulate: Greater than 200 Feet, w/ Stand By Assist (and appropriate assistive device as needed)  Pt Will Go Up / Down Stairs: 1-2 Stairs, w/ Stand By Assist    PLAN:  Plan   Plan Frequency: 5 Days per Week  Plan for progression: Continue to work on increasing ambulation distances and wean walker as appropriate, assess bed mobility from flat surface - anticipate likely x1 follow up visit if patient remains in acute setting early next week.    RECOMMENDATIONS:  PT Discharge Recommendations  PT Discharge Recommendations: Home with Assistance  Equipment Recommendations: Roller Walker    Patient requires the use of a walker with wheels to complete ADL???s in the home including meal preparation, ambulation the bathroom for toileting, bathing and grooming, and safe home mobility.  Patient is unable to complete these ADL???s  with a cane or crutch and can safely use the walker.     Therapist: Elvina Sidle, PT  Date: 07/16/2017

## 2017-07-16 NOTE — Progress Notes
Critical Care Progress Note          Today's Date:  07/16/2017  Name:  Gregory Acosta                       MRN:  [id number]   Admission Date: 07/10/2017  LOS: 6 days                     Assessment/Plan:   Principal Problem:    Unstable angina (HCC)  Active Problems:    Type 2 diabetes mellitus without complication, without long-term current use of insulin (HCC)    Essential hypertension    Mixed hyperlipidemia    Obstructive sleep apnea syndrome    Obesity    Coronary artery disease involving native coronary artery of native heart    Chronic diastolic CHF (congestive heart failure), NYHA class 2 (HCC)    Vasogenic shock (HCC)        ???  61???yo male???with OSA on CPAP, spinal stenosis, uncontrolled IDDM (HbA1c = 10.9), PAD, COPD, LVH  ???  Neuro: Prn pain meds; prn Xanax, PTA Mirapex  Cardiac: bASA, Crestor, Weaned off DBA thus hold BB for now  Pulmonary:???I/S, OOB  FEN: AAT  ID:??????periop abx complete  Renal: ???stable  Heme: ???stable  Endo: ???Modified Yale Insulin Protocol, Appreciate endocrine consult  Prophylaxis: HOB>40, PPI, DVT prophylaxis per primary team  Disposition/Family: stable for tele  __________________________________________________________________________________  Subjective:  Gregory Acosta is a 61 y.o. male.  Overnight Events: Significant event:   off DBA.  Patient better CVO2  Objective:  Medications:  Scheduled Meds:  acetaminophen (TYLENOL) tablet 1,000 mg 1,000 mg Oral Q6H*   aspirin chewable tablet 81 mg 81 mg Oral QDAY   ezetimibe (ZETIA) tablet 10 mg 10 mg Oral QDAY   fluticasone (FLOVENT HFA) 110 mcg/actuation inhaler 2 puff 2 puff Inhalation BID   insulin aspart U-100 (NOVOLOG FLEXPEN) injection PEN 0-14 Units 0-14 Units Subcutaneous ACHS   insulin aspart U-100 (NOVOLOG FLEXPEN) injection PEN 12 Units 12 Units Subcutaneous TID after meals   insulin glargine (LANTUS SOLOSTAR, BASAGLAR) injection PEN 55 Units 55 Units Subcutaneous QDAY(12)   pramipexole (MIRAPEX) tablet 0.25 mg 0.25 mg Oral QHS rosuvastatin (CRESTOR) tablet 10 mg 10 mg Oral Once per day on Mon Thu   senna/docusate (SENOKOT-S) tablet 2 tablet 2 tablet Oral BID   Continuous Infusions:  PRN and Respiratory Meds:[START ON 07/19/2017] acetaminophen Q6H PRN **OR** [START ON 07/19/2017] acetaminophen Q6H PRN, albuterol Q4H PRN, alum/mag hydroxide/simeth Q4H PRN, bisacodyl QDAY PRN, hydrALAZINE Q6H PRN, lidocaine PF PRN, milk of magnesia (CONC) QDAY PRN, ondansetron Q6H PRN **OR** ondansetron (ZOFRAN) IV Q6H PRN, oxyCODONE Q3H PRN, potassium chloride SR PRN **OR** potassium chloride PRN **OR** potassium chloride in water PRN, pramipexole QDAY PRN, traMADol Q6H PRN                     Vital Signs: Last Filed                  Vital Signs: 24 Hour Range   BP: 149/77 (07/20 0900)  ABP: 193/91 (07/20 0200)  Temp: 36.7 ???C (98 ???F) (07/20 0800)  Pulse: 103 (07/20 0900)  Respirations: 19 PER MINUTE (07/20 0900)  SpO2: 96 % (07/20 0900)  O2 Delivery: None (Room Air) (07/20 0900)  Weight: 137.2 kg (302 lb 7.5 oz) (07/20 0500)  BP: (136-163)/(72-86)   ABP: (122-196)/(52-91)   Temp:  [36.7 ???C (98 ???F)-37.2 ???C (98.9 ???F)]  Pulse:  [80-111]   Respirations:  [13 PER MINUTE-29 PER MINUTE]   SpO2:  [93 %-99 %]   O2 Delivery: None (Room Air)    Intensity Pain Scale 0-10 (Pain 1): (not recorded) Vitals:    07/13/17 0600 07/15/17 0600 07/16/17 0500   Weight: (!) 138.6 kg (305 lb 8.9 oz) 134.9 kg (297 lb 6.4 oz) (!) 137.2 kg (302 lb 7.5 oz)       Critical Care Vitals:     ICP Monitoring:     PA  Catheter:    Hemodynamics/Oxycalcs:      Intake/Output Summary:  (Last 24 hours)    Intake/Output Summary (Last 24 hours) at 07/16/17 1050  Last data filed at 07/16/17 0900   Gross per 24 hour   Intake          1626.11 ml   Output             2372 ml   Net          -745.89 ml         Physical Exam:  General:  no distress, appears stated age  Lungs:  Clear to auscultation bilaterally  CV: RRR  Abdomen:  Soft, non-tender.  Bowel sounds normal.  No masses.  No organomegaly. Extremities:  Extremities normal, atraumatic, no cyanosis or edema  Neurologic:  CNII - XII intact.  PERRL      Prophylaxis Review:  Lines:  Yes; Arterial Line; Indication:  Frequent blood draws and Continuous BP monitoring; Location:  Radial  Central Line; Indication:  Hemodynamic monitoring; Type:  Internal jugular  Urinary Catheter:  Yes; Retain foley due to:  Need for accurate Intake and Output  Antibiotic Usage:  No  VTE: none    Lab Review:  Pertinent labs reviewed  Point of Care Testing:  (Last 24 hours):  Glucose: (!) 261 (07/16/17 0210)  POC Glucose (Download): (!) 300 (07/16/17 0831)    Radiology and Other Diagnostic Procedures Review:    Pertinent radiology reviewed.    I have seen, examined and reviewed data concerning this patient.  I discussed the findings and plan of care with the ICU team. I spent 45 minutes in critical care time, excluding procedures today.    Georgiann Mohs, MD  Associate Professor  Anesthesiology and Critical Care  [phone]

## 2017-07-16 NOTE — Progress Notes
NAE ON.  Down to dobutamine 1 mcg; Cr trending down with good UOP.  CXR remains clear with modest lung volumes--home CPAP overnight.   Wean dobutamine off today; pull CTs.  Continue to mobilize.  Transition to floor today.    Oneal GroutAshley Kyndall Amero MD

## 2017-07-16 NOTE — Progress Notes
Looks great.  Stop dobutamine and pull drains.  Ok for telemetry. Discussed w pt.

## 2017-07-16 NOTE — Progress Notes
0345-Reassessment complete. Tresa EndoKelly APRN notified of pts central venous level, no new orders at this time. Pt resting comfortably. Arterial line removed previously per K. Journalist, newspaperBaker APRN. See flowsheet for details of assessment, will continue to monitor.     Gtts:    Dobutamine @ 1  NS @ 30

## 2017-07-16 NOTE — Progress Notes
Endocrinology Progress Note      Name:  Gregory Acosta   Today's Date:  07/16/2017  Admission Date: 07/10/2017  LOS: 6 days         Principal Problem:    Unstable angina (HCC)  Active Problems:    Type 2 diabetes mellitus without complication, without long-term current use of insulin (HCC)    Essential hypertension    Mixed hyperlipidemia    Obstructive sleep apnea syndrome    Obesity    Coronary artery disease involving native coronary artery of native heart    Chronic diastolic CHF (congestive heart failure), NYHA class 2 (HCC)                   Assessment/Plan:        Gregory Acosta???is a 61 y.o. male with chest pain, HTN, HLD, PAD, RLS, diabetes type 2, spinal stenosis, OSA on CPAP, obesity, who was admitted for chest pain rule out endocrinology has been consulted for diabetes management in the setting of high PTA insulin and no insurance.  ???  Type 2???Diabetes Mellitus, uncontrolled  A1c 10.9, uncontrolled  PTA regimen: levemir 80???units BID???and humalog 50???units TID w/ meals, as well as metformin and Glimepiride  Hypoglycemic episodes on this regimen: rare 3-4 times yearly, misses a lot of meal time dosing  Follows up with Dr. [name]???for diabetes management  Diabetic-complications assessment:  ???????????????????????????????????????Retinopathy: yes.   ???????????????????????????????????????Peripheral neuropathy: yes  ???????????????????????????????????????Autonomic neuropathy: no  ???????????????????????????????????????Nephropathy: no.   ???????????????????????????????????????Macrovascular complications: Yes???known CAD  Risk Factor assessment  ???????????????????????????????????????Last lipid profile 07/11/2017  ??????????????????????????????????????????????????????????????????????????????LDL 204???(goal <70)  ??????????????????????????????????????????????????????????????????????????????TG 329???(goal <150)  ???????????????????????????????????????BP at goal (goal <???140/80)  On ACEi/ARB?: Losartan  On Statin?: intolerance  ???  Chest pain  ???  PAD  HLD  HTN: Losartan  RLS  OSA on CPAP  ???  Impression / Recommendations    Inc Lantus 55 daily  Inc Aspart 12 unit post meal  MDCF    Pt was discussed with Dr. Earney Hamburg, DO   Pager [phone]     Subjective Gregory Acosta is a 61 y.o. male.  Patient feeling well today with improved appetite we will watch closely as he may need pre-meal dosing once his appetite is good and he is eating regularly.    Denies fever, chills, nausea, vomiting    Physical Exam    General:  Alert, cooperative, no distress, appears stated age  Head:  Normocephalic, atraumatic  Eyes:  EOMI  Lungs:  Non-labored  Abdomen:  Obese abdomen  Skin:   No rashes  Extremities: No cyanosis   Neurologic:  No focal neurologic deficits  Psych: appropriate affect    Medications  Scheduled Meds:    acetaminophen (TYLENOL) tablet 1,000 mg 1,000 mg Oral Q6H*   aspirin chewable tablet 81 mg 81 mg Oral QDAY   ezetimibe (ZETIA) tablet 10 mg 10 mg Oral QDAY   fluticasone (FLOVENT HFA) 110 mcg/actuation inhaler 2 puff 2 puff Inhalation BID   insulin aspart U-100 (NOVOLOG FLEXPEN) injection PEN 0-14 Units 0-14 Units Subcutaneous ACHS   insulin aspart U-100 (NOVOLOG FLEXPEN) injection PEN 12 Units 12 Units Subcutaneous TID after meals   insulin glargine (LANTUS SOLOSTAR, BASAGLAR) injection PEN 55 Units 55 Units Subcutaneous QDAY(12)   metoprolol tartrate (LOPRESSOR) tablet 12.5 mg 12.5 mg Oral BID   pramipexole (MIRAPEX) tablet 0.25 mg 0.25 mg Oral QHS   rosuvastatin (CRESTOR) tablet 10 mg 10 mg Oral Once per day on Mon Thu   senna/docusate (SENOKOT-S)  tablet 2 tablet 2 tablet Oral BID   Continuous Infusions:    PRN and Respiratory Meds:[START ON 07/19/2017] acetaminophen Q6H PRN **OR** [START ON 07/19/2017] acetaminophen Q6H PRN, albuterol Q4H PRN, alum/mag hydroxide/simeth Q4H PRN, bisacodyl QDAY PRN, hydrALAZINE Q6H PRN, lidocaine PF PRN, milk of magnesia (CONC) QDAY PRN, ondansetron Q6H PRN **OR** ondansetron (ZOFRAN) IV Q6H PRN, oxyCODONE Q3H PRN, potassium chloride SR PRN **OR** potassium chloride PRN **OR** potassium chloride in water PRN, pramipexole QDAY PRN, traMADol Q6H PRN      Objective Vital Signs: Last Filed                 Vital Signs: 24 Hour Range   BP: 134/73 (07/20 1219)  Temp: 37.1 ???C (98.8 ???F) (07/20 1200)  Pulse: 98 (07/20 1241)  Respirations: 28 PER MINUTE (07/20 1241)  SpO2: 90 % (07/20 1219)  O2 Delivery: None (Room Air) (07/20 1200)  SpO2 Pulse: 93 (07/20 1219) BP: (134-163)/(72-86)   ABP: (122-196)/(52-91)   Temp:  [36.7 ???C (98 ???F)-37.2 ???C (98.9 ???F)]   Pulse:  [91-111]   Respirations:  [13 PER MINUTE-31 PER MINUTE]   SpO2:  [90 %-98 %]   O2 Delivery: None (Room Air)   Intensity Pain Scale 0-10 (Pain 1): 1 (07/16/17 1200) Vitals:    07/13/17 0600 07/15/17 0600 07/16/17 0500   Weight: (!) 138.6 kg (305 lb 8.9 oz) 134.9 kg (297 lb 6.4 oz) (!) 137.2 kg (302 lb 7.5 oz)       Intake/Output Summary:  (Last 24 hours)    Intake/Output Summary (Last 24 hours) at 07/16/17 1408  Last data filed at 07/16/17 1200   Gross per 24 hour   Intake           1768.3 ml   Output             1995 ml   Net           -226.7 ml      Stool Occurrence: 1    Lab Review  24-hour labs:    Results for orders placed or performed during the hospital encounter of 07/10/17 (from the past 24 hour(s))   O2 SATURATION, CENTRAL VENOUS    Collection Time: 07/15/17  3:50 PM   Result Value Ref Range    O2 Sat, Central Venous 59.6 %   POC GLUCOSE    Collection Time: 07/15/17  4:48 PM   Result Value Ref Range    Glucose, POC 213 (H) 70 - 100 MG/DL   POC GLUCOSE    Collection Time: 07/15/17  6:07 PM   Result Value Ref Range    Glucose, POC 255 (H) 70 - 100 MG/DL   POC GLUCOSE    Collection Time: 07/15/17  8:11 PM   Result Value Ref Range    Glucose, POC 271 (H) 70 - 100 MG/DL   POC GLUCOSE    Collection Time: 07/16/17  2:09 AM   Result Value Ref Range    Glucose, POC 269 (H) 70 - 100 MG/DL   BLOOD GASES, CENTRAL VENOUS    Collection Time: 07/16/17  2:10 AM   Result Value Ref Range    PH-Central Venous 7.37 7.30 - 7.40    PCO2-Central Venous 35 (L) >40 MMHG    PO2-Central Venous 29 (L) 40 - 50 MMHG Base Deficit-Central Venous 4.4 MMOL/L    O2 Sat (Calc)-Central Venous 47.1 (L) 65 - 75 %    Bicarb-Central Venous 19.9 MMOL/L   CBC  Collection Time: 07/16/17  2:10 AM   Result Value Ref Range    White Blood Cells 21.7 (H) 4.5 - 11.0 K/UL    RBC 3.87 (L) 4.4 - 5.5 M/UL    Hemoglobin 11.8 (L) 13.5 - 16.5 GM/DL    Hematocrit 45.4 (L) 40 - 50 %    MCV 90.9 80 - 100 FL    MCH 30.6 26 - 34 PG    MCHC 33.7 32.0 - 36.0 G/DL    RDW 09.8 (H) 11 - 15 %    Platelet Count 230 150 - 400 K/UL    MPV 8.7 7 - 11 FL   BASIC METABOLIC PANEL    Collection Time: 07/16/17  2:10 AM   Result Value Ref Range    Sodium 130 (L) 137 - 147 MMOL/L    Potassium 4.7 3.5 - 5.1 MMOL/L    Chloride 101 98 - 110 MMOL/L    CO2 16 (L) 21 - 30 MMOL/L    Anion Gap 13 (H) 3 - 12    Glucose 261 (H) 70 - 100 MG/DL    Blood Urea Nitrogen 16 7 - 25 MG/DL    Creatinine 1.19 0.4 - 1.24 MG/DL    Calcium 9.0 8.5 - 14.7 MG/DL    eGFR Non African American >60 >60 mL/min    eGFR African American >60 >60 mL/min   O2 SATURATION, CENTRAL VENOUS    Collection Time: 07/16/17  2:10 AM   Result Value Ref Range    O2 Sat, Central Venous 47.1 %   POC GLUCOSE    Collection Time: 07/16/17  8:31 AM   Result Value Ref Range    Glucose, POC 300 (H) 70 - 100 MG/DL   POC GLUCOSE    Collection Time: 07/16/17 12:21 PM   Result Value Ref Range    Glucose, POC 275 (H) 70 - 100 MG/DL       Point of Care Testing  (Last 24 hours)  Glucose: (!) 261 (07/16/17 0210)  POC Glucose (Download): (!) 275 (07/16/17 1221)    Radiology and other Diagnostics Review:    Pertinent radiology reviewed.

## 2017-07-16 NOTE — Progress Notes
Assumed care at 1600. VSS. A/Ox4. SR on telemetry. Incisions c/d/i. UOP not accurate d/t pt voiding in toilet. No BM since arrival on unit but is passing gas. Assist x 1 c walker. No other complaints. Pain controlled with current regimen. Will continue to monitor.

## 2017-07-16 NOTE — Discharge Instructions - Pharmacy
Physician Discharge Summary      Name: Gregory Acosta  Medical Record Number: [id number]        Account Number:  [id number]  Date Of Birth:  1956/11/05                         Age:  61 years   Admit date:  07/10/2017                     Discharge date:  07/20/2017    Attending Physician: Dr Elias Else               Service: Cardiothor Surg    Physician Summary completed by: Shanda Bumps, PA-C    Reason for hospitalization: Unstable angina.     Significant PMH:   Past Medical History:   Diagnosis Date   ??? Chest pain    ??? Essential hypertension 07/05/2017   ??? Family history of coronary artery disease 07/05/2017   ??? Left ventricular hypertrophy 07/05/2017    08/20/2016 Echo ([facility]): Normal EF, 70%. Moderate concentric left ventricular hypertrophy. No significant valvular abnormalities.    ??? Mixed hyperlipidemia 07/05/2017   ??? Obstructive sleep apnea syndrome 07/05/2017   ??? Peripheral artery disease (HCC) 07/05/2017    03/02/2014 LE Arterial Duplex ([facility]): mild diffuse calcified plaque with triphasic waveforms throughout both lower extremities.    ??? Restless leg syndrome    ??? Spinal stenosis    ??? Type 2 diabetes mellitus without complication, without long-term current use of insulin (HCC) 07/05/2017         Allergies: Statins-hmg-coa reductase inhibitors; Iodine and iodide containing products; and Iv contrast dye, iodine containing [iodinated contrast- oral and iv dye]    Admission Physical Exam notable for: Unstable angina. No ST segment changes. EF 70%     Admission Lab/Radiology studies notable for: A1C 10.5    Brief Hospital Course:  The patient presented to the ER on 7/14 with unstable angina. His chest pain was 8/10 that awoke him from sleep. He was taken for a heart cath on 7/16 due to his uncontrolled glucose and anion gap. Endocrine was consulted for an A1C of 10.9% and an insulin gtt was started. Cath films reveled CTO of circumflex, RCA and LAD disease. CTS was consulted for surgical evaluation.  He was noted to have a leukocytosis related to receiving steroids for his contrast allergy.  He underwent cabg on 7/18 and tolerated the procedure well. Introp TEE LVEF50%. He was recovered in the ICU. His CTs were removed POD2. He required a small amount of Dobutamine which was also weaned off POD 2 and he was transferred to the floor. He was diuresed daily and started on a beta-blocker. Endocrinology made adjustments to his insulin dosing and amaryl was discontinued.  He increased his activity. He had normal bladder and bowel function and was able to be discharged POD 6.    Condition at Discharge: Stable    Discharge Diagnoses:      Hospital Problems        Active Problems    * (Principal)Unstable angina (HCC)    Type 2 diabetes mellitus without complication, without long-term current use of insulin (HCC)    Essential hypertension    Mixed hyperlipidemia    Obstructive sleep apnea syndrome    Coronary artery disease involving native coronary artery of native heart    Chronic diastolic CHF (congestive heart failure), NYHA class 2 (HCC)  Morbid obesity with BMI of 40.0-44.9, adult (HCC)    Hyponatremia       Resolved Problems    RESOLVED: Vasogenic shock (HCC)          Surgical Procedures: BYPASS GRAFT CORONARY ARTERYx4 , LIMA, LEVH    Significant Diagnostic Studies and Procedures: noted in brief hospital course    Consults:  CTS and Endocrinology    Patient Disposition: Home       Patient instructions/medications:     CHEST 2 VIEWS   Standing Status: Future  Standing Exp. Date: 07/19/18      Reason for exam:(Sign,Symptom,Reason) s/p cabg      Other Activity Restrictions   -You should and need to walk daily. Your goal is to walk 30 minutes at at time without stopping for breaks. This is a daily exercise routine that you should start as soon as you arrive home. Your basic daily activities do not count toward your 30 minute minimum, e.g. housework, toileting, fixing meals. Do not exercise outside in extremely hot or cold temperatures. -Bathing: NO tub baths, hot tubs, or swimming for 6 weeks. You may shower at any time.  -Driving: NO driving for 2 weeks or while taking narcotics  -Lifting: NO lifting more than 10 pounds (gallon of milk) for 6 weeks.  -Monitoring: If you have access to a home blood pressure cuff, record your blood pressure and heart rate daily.   Please call if your systolic blood pressure is <90 or >160, OR  if your heart rate is <50 or >120 at rest     INSTRUCTIONS, ADDITIONAL   Please review your Diabetes Discharge Instructions regarding your insulin does including instructions on administration of a correction factor.     Report These Signs and Symptoms   Report these signs and symptoms   Please contact your doctor for the following symptoms:   *Temperature over 100 degrees F  *Uncontrolled pain  *Drainage with a foul odor  *Shortness of breath  *Racing or skipping heart beats  *Popping or clicking of your breastbone   *Suture material sticking up though your incisions.  *Change in coordination or ability to talk  *If you gain more than 2 pounds in 24 hours, or 5 pounds in one week.     Questions About Your Stay   For questions or concerns regarding your hospital stay. Call [phone]   Discharging attending physician: Lynne Logan [[id number]]      Diabetic Diet   You should eat between 1600 and 2000 calories per day.  This is equal to 60g (grams) of carbohydrates per meal, and 30g of carbohydrates for a bedtime snack.  If you have questions about your diet after you go home, you can call a dietitian at [phone].     Cardiac Diet   Limiting unhealthy fats and cholesterol is the most important step you can take in reducing your risk for cardiovascular disease. Unhealthy fats include saturated and trans fats. Monitor your sodium and cholesterol intake. Restrict your sodium to 2g (grams) or 2000mg  (milligrams) daily, and your cholesterol to 200mg  daily.    If you have questions regarding your diet at home, you may contact a dietitian at [phone].     Return Appointment   Arrive at 12:30 for a chest xray, appt at 1:30 pm   [place] Provider Elias Else [facility] [[id number]]    Location [facility]    Appointment date: 08/23/2017    Appointment time: 12:30 PM  Opioid (Narcotic) Safety Information   OPIOID (NARCOTIC) PAIN MEDICATION SAFETY    We care about your comfort, and believe you need opioid medications at this time to treat your pain.  An opioid is a strong pain medication.  It is only available by prescription for moderate to severe pain.  Usually these medications are used for only a short time to treat pain, but sometimes will be prescribed for longer.  Talk with your doctor or nurse about how long they expect you to need this medication.    When used the right way, opioids are safe and effective medications to treat your pain, even when used for a long time.  Yet, when used in the wrong way, opioids can be dangerous for you or others.  Opioids do not work for everyone.  Most patients do not get full relief of their pain from opioid medication; full relief of your pain may not be possible.     For your safety, we ask you to follow these instructions:    *Only take your opioid medication as prescribed.  If your pain is not controlled with the prescribed dose, or the medication is not lasting long enough, call your doctor.  *Do not break or crush your opioid medication unless your doctor or pharmacist says you can.  With certain medications, this can be dangerous, and may cause death.  *Never share your medications with others, even if they appear to have a good reason.  Never take someone else's pain medication-this is dangerous, and illegal (a crime).  Overdoses and deaths have occurred.  *Keep your opioid medications safe, as you would with cash, in a lock box or similar container.  *Make sure your opioids are going to be secure, especially if you are around children or teens. *Talk with your doctor or pharmacist before you take other medications.  *Avoid driving, operating machinery, or drinking alcohol while taking opioid pain medication.  This may be unsafe.    Pain medications can cause constipation. Constipation is bowel movements that are less often than normal. Stools often become very hard and difficult to pass. This may lead to stomach pain and bloating. It may also cause pain when trying to use the bathroom. Constipation may be treated with suppositories, laxatives or stool softeners. A diet high in fiber with plenty of fluids helps to maintain regular, soft bowel movements.     Cardiac Rehab   Your physician has referred you to outpatient cardiac rehab. Contact [facility] Cardiac Rehab Department at [phone] to schedule an appointment.     Return Appointment   Patient needs to call for a follow-up appointment in 1- 2 weeks.   Outside Provider Dr. Alona Bene      Return Appointment   Cardiology will contact you regarding your follow up appointment.  If you are not contacted in 5-7 business days after discharge please call [phone].   [place] Provider [place], [place] A [phone]      REQUEST FOR CARDIOLOGY APPOINTMENT   Standing Status: Future  Standing Exp. Date: 07/20/22   Scheduling Priority: Routine    Schedule OV with (1st choice Provider) Idamae Lusher M.D.    Special Visit Type Post Hospital Follow-up         Current Discharge Medication List       START taking these medications    Details   ezetimibe (ZETIA) 10 mg tablet Take 1 tablet by mouth daily.  Qty: 90 tablet, Refills: 3  PRESCRIPTION TYPE:  Normal      metoprolol tartrate (LOPRESSOR) 25 mg tablet Take 1 tablet by mouth twice daily.  Qty: 180 tablet, Refills: 3    PRESCRIPTION TYPE:  Normal      rosuvastatin (CRESTOR) 10 mg tablet Take 1 tablet by mouth twice weekly.  Qty: 90 tablet, Refills: 3    PRESCRIPTION TYPE:  Normal senna/docusate (SENOKOT-S) 8.6/50 mg tablet Take 2 tablets by mouth twice daily.  Qty: 60 tablet, Refills: 0    PRESCRIPTION TYPE:  Normal      traMADol (ULTRAM) 50 mg tablet Take 1 tablet by mouth every 6 hours as needed for Pain.  Qty: 30 tablet, Refills: 0    PRESCRIPTION TYPE:  Print          CONTINUE these medications which have been CHANGED or REFILLED    Details   insulin detemir(+) (LEVEMIR) 100 unit/mL soln Inject 42 Units under the skin every morning.  Qty: 10 mL, Refills: 12    PRESCRIPTION TYPE:  Normal      insulin lispro(+) (HUMALOG KWIKPEN INSULIN) 100 unit/mL injection PEN Inject 18 Units under the skin three times daily with meals. Please see other instructions regarding additional meal time correction factor based on pre-meal blood sugar.  Qty: 45 mL    PRESCRIPTION TYPE:  No Print      losartan(+) (COZAAR) 100 mg tablet Take 0.5 tablets by mouth daily.  Qty: 90 tablet, Refills: 3    PRESCRIPTION TYPE:  Normal      metFORMIN (GLUCOPHAGE) 1,000 mg tablet Take 1/2 tab with dinner for a week. Then increase to 1/2 tab with breakfast and dinner for a week. Then increase to 1 tab with breakfast and 1 tab with dinner thereafter.  Qty: 180 tablet, Refills: 3    PRESCRIPTION TYPE:  No Print          CONTINUE these medications which have NOT CHANGED    Details   albuterol (PROAIR HFA, VENTOLIN HFA, OR PROVENTIL HFA) 90 mcg/actuation inhaler Inhale 2 puffs by mouth into the lungs every 8 hours as needed for Wheezing or Shortness of Breath. Shake well before use.    PRESCRIPTION TYPE:  Historical Med      ALPRAZolam (XANAX) 0.25 mg tablet Take 0.25 mg by mouth three times daily as needed for Anxiety.    PRESCRIPTION TYPE:  Historical Med      aspirin EC 81 mg tablet Take 1 tablet by mouth daily. Take with food.  Qty: 90 tablet, Refills: 3    PRESCRIPTION TYPE:  No Print      fluticasone (FLOVENT HFA) 110 mcg/actuation inhaler Inhale 2 puffs by mouth into the lungs twice daily. PRESCRIPTION TYPE:  Historical Med      furosemide (LASIX) 40 mg tablet Take 40 mg by mouth every morning.    PRESCRIPTION TYPE:  Historical Med      pramipexole (MIRAPEX) 0.25 mg tablet Take 1 tablet by mouth every evening. Take an additional 1 to 2 tablets by mouth at bedtime as needed for restless leg syndrome.    PRESCRIPTION TYPE:  Historical Med          The following medications were removed from your list. This list includes medications discontinued this stay and those removed from your prior med list in our system        glimepiride (AMARYL) 4 mg tablet        metFORMIN-ER(+) (FORTAMET) 1,000 mg extended release tablet  prednisone (DELTASONE) 20 mg tablet               Scheduled appointments:    Aug 23, 2017  1:30 PM CDT  Post - Op with Lynne Logan, MD  [facility]) [facility] [id number]  [address]  [place] [address]  [phone]          Pending items needing follow up: none    Signed:  Shanda Bumps, PA-C  07/21/2017      cc:  Primary Care Physician:  Rockwell [place]   Verified  Referring physicians:  No ref. provider found   Additional provider(s):

## 2017-07-16 NOTE — Progress Notes
Reassessment performed. No acute changes at this time. Pain controlled. Afebrile. Hemodynamics stable and WNL per trends. No void since catheter removal, will continue to encourage. No gtts.

## 2017-07-16 NOTE — Progress Notes
OCCUPATIONAL THERAPY  NOTE    Patient in chair upon arrival and declines activity at this time because he just finished with respiratory therapy and wants to finish his breakfast. Discussed prior level of function and available assistance at home. Patient's spouse is willing/able to assist with ADLs as needed and perform IADLs. Occupational therapy will continue to follow and provide evaluation as able.    Home Living  Type of Home: House  Home Layout: Stairs to Enter w/o [place] Gateway[place]ails;Performs ADL'S on One Level  Financial risk analystBathroom Shower / Tub: Tub/Shower Unit  Bathroom Toilet: Raised  Bathroom Equipment: Engineer, materialsGrab Bars in Owens CorningShower    Prior Function  Level Of Independence: Independent with ADLs and functional transfers;Independent with homemaking w/ ambulation  Lives With: Spouse  Receives Help From: None Needed     OT Discharge Recommendations  Equipment Recommendations: Tub Transfer Bench    Therapist: Azucena KubaJulie Pearce Valladares, OTR/L  Date: 07/16/2017

## 2017-07-17 DIAGNOSIS — R0789 Other chest pain: Secondary | ICD-10-CM

## 2017-07-17 LAB — POC GLUCOSE
Lab: 361 mg/dL — ABNORMAL HIGH (ref 70–100)
Lab: 295 mg/dL — ABNORMAL HIGH (ref 70–100)
Lab: 271 mg/dL — ABNORMAL HIGH (ref 70–100)
Lab: 181 mg/dL — ABNORMAL HIGH (ref 70–100)

## 2017-07-17 LAB — BASIC METABOLIC PANEL: Lab: 129 MMOL/L — ABNORMAL LOW (ref >60)

## 2017-07-17 LAB — CBC: Lab: 20 K/UL — ABNORMAL HIGH (ref 4.5–11.0)

## 2017-07-17 MED ORDER — METOPROLOL TARTRATE 25 MG PO TAB
12.5 mg | Freq: Two times a day (BID) | ORAL | 0 refills | Status: DC
Start: 2017-07-17 — End: 2017-07-18

## 2017-07-17 MED ORDER — POLYETHYLENE GLYCOL 3350 17 GRAM PO PWPK
1 | Freq: Every day | ORAL | 0 refills | Status: DC
Start: 2017-07-17 — End: 2017-07-20
  Administered 2017-07-18 – 2017-07-19 (×2): 17 g via ORAL

## 2017-07-17 MED ORDER — FUROSEMIDE 40 MG PO TAB
40 mg | Freq: Every day | ORAL | 0 refills | Status: DC
Start: 2017-07-17 — End: 2017-07-20
  Administered 2017-07-17 – 2017-07-20 (×4): 40 mg via ORAL

## 2017-07-17 MED ORDER — METOPROLOL TARTRATE 25 MG PO TAB
25 mg | Freq: Two times a day (BID) | ORAL | 0 refills | Status: DC
Start: 2017-07-17 — End: 2017-07-17
  Administered 2017-07-17: 13:00:00 25 mg via ORAL

## 2017-07-17 MED ORDER — INSULIN ASPART 100 UNIT/ML SC FLEXPEN
16 [IU] | Freq: Three times a day (TID) | SUBCUTANEOUS | 0 refills | Status: DC
Start: 2017-07-17 — End: 2017-07-19

## 2017-07-17 MED ORDER — INSULIN GLARGINE 100 UNIT/ML (3 ML) SC INJ PEN
65 [IU] | Freq: Every day | SUBCUTANEOUS | 0 refills | Status: DC
Start: 2017-07-17 — End: 2017-07-18

## 2017-07-17 NOTE — Progress Notes
Endocrinology Progress Note      Name:  Gregory Acosta   Today's Date:  07/17/2017  Admission Date: 07/10/2017  LOS: 7 days         Principal Problem:    Unstable angina (HCC)  Active Problems:    Type 2 diabetes mellitus without complication, without long-term current use of insulin (HCC)    Essential hypertension    Mixed hyperlipidemia    Obstructive sleep apnea syndrome    Obesity    Coronary artery disease involving native coronary artery of native heart    Chronic diastolic CHF (congestive heart failure), NYHA class 2 (HCC)                   Assessment/Plan:        Gregory Acostais a 61 y.o. male with chest pain, HTN, HLD, PAD, RLS, diabetes type 2, spinal stenosis, OSA on CPAP, obesity, who was admitted for chest pain rule out endocrinology has been consulted for diabetes management in the setting of high PTA insulin and no insurance.  ???  Type 2???Diabetes Mellitus, uncontrolled  A1c 10.9, uncontrolled  PTA regimen: levemir 80???units BID???and humalog 50???units TID w/ meals, as well as metformin and Glimepiride  Hypoglycemic episodes on this regimen: rare 3-4 times yearly, misses a lot of meal time dosing  Follows up with Dr. [name]???for diabetes management  Diabetic-complications assessment:  ???????????????????????????????????????Retinopathy: yes.   ???????????????????????????????????????Peripheral neuropathy: yes  ???????????????????????????????????????Autonomic neuropathy: no  ???????????????????????????????????????Nephropathy: no.   ???????????????????????????????????????Macrovascular complications: Yes???known CAD  Risk Factor assessment  ???????????????????????????????????????Last lipid profile 07/11/2017  ??????????????????????????????????????????????????????????????????????????????LDL 204???(goal <70)  ??????????????????????????????????????????????????????????????????????????????TG 329???(goal <150)  ???????????????????????????????????????BP at goal (goal <???140/80)  On ACEi/ARB?: Losartan  On Statin?: intolerance  ???  Chest pain  ???  PAD  HLD  HTN: Losartan  RLS  OSA on CPAP  ???  Impression / Recommendations    Inc Lantus 65 daily  Inc Aspart 16 unit post meal  MDCF    Pt was discussed with Dr. Astrid Drafts, MD   Pager [phone]     Subjective Gregory Acosta is a 61 y.o. male.  Patient feeling well today with improved appetite we will watch closely as he may need pre-meal dosing once his appetite is good and he is eating regularly.    Denies fever, chills, nausea, vomiting    Physical Exam    General:  Alert, cooperative, no distress, appears stated age  Head:  Normocephalic, atraumatic  Eyes:  EOMI  Lungs:  Non-labored  Abdomen:  Obese abdomen  Skin:   No rashes  Extremities: No cyanosis   Neurologic:  No focal neurologic deficits  Psych: appropriate affect    Medications  Scheduled Meds:    acetaminophen (TYLENOL) tablet 1,000 mg 1,000 mg Oral Q6H*   aspirin chewable tablet 81 mg 81 mg Oral QDAY   ezetimibe (ZETIA) tablet 10 mg 10 mg Oral QDAY   fluticasone (FLOVENT HFA) 110 mcg/actuation inhaler 2 puff 2 puff Inhalation BID   furosemide (LASIX) tablet 40 mg 40 mg Oral QDAY   insulin aspart U-100 (NOVOLOG FLEXPEN) injection PEN 0-14 Units 0-14 Units Subcutaneous ACHS   insulin aspart U-100 (NOVOLOG FLEXPEN) injection PEN 16 Units 16 Units Subcutaneous TID w/ meals   insulin glargine (LANTUS SOLOSTAR, BASAGLAR) injection PEN 65 Units 65 Units Subcutaneous QDAY(12)   metoprolol tartrate (LOPRESSOR) tablet 25 mg 25 mg Oral BID   polyethylene glycol 3350 (MIRALAX) packet 17 g 1 packet Oral QDAY   pramipexole (MIRAPEX) tablet 0.25 mg  0.25 mg Oral QHS   rosuvastatin (CRESTOR) tablet 10 mg 10 mg Oral Once per day on Mon Thu   senna/docusate (SENOKOT-S) tablet 2 tablet 2 tablet Oral BID   Continuous Infusions:    PRN and Respiratory Meds:[START ON 07/19/2017] acetaminophen Q6H PRN **OR** [START ON 07/19/2017] acetaminophen Q6H PRN, albuterol Q4H PRN, alum/mag hydroxide/simeth Q4H PRN, bisacodyl QDAY PRN, hydrALAZINE Q6H PRN, lidocaine PF PRN, milk of magnesia (CONC) QDAY PRN, ondansetron Q6H PRN **OR** ondansetron (ZOFRAN) IV Q6H PRN, oxyCODONE Q3H PRN, potassium chloride SR PRN **OR** potassium chloride PRN **OR** potassium chloride in water PRN, pramipexole QDAY PRN, traMADol Q6H PRN      Objective                       Vital Signs: Last Filed                 Vital Signs: 24 Hour Range   BP: 170/96 (07/21 0750)  Temp: 36.4 ???C (97.6 ???F) (07/21 0750)  Pulse: 102 (07/21 0750)  Respirations: 20 PER MINUTE (07/21 0750)  SpO2: 96 % (07/21 0750)  O2 Delivery: None (Room Air) (07/21 0750)  SpO2 Pulse: 93 (07/20 1219) BP: (134-170)/(71-97)   Temp:  [36.4 ???C (97.6 ???F)-37.1 ???C (98.8 ???F)]   Pulse:  [91-102]   Respirations:  [15 PER MINUTE-31 PER MINUTE]   SpO2:  [90 %-98 %]   O2 Delivery: None (Room Air)   Intensity Pain Scale 0-10 (Pain 1): 5 (07/17/17 0415) Vitals:    07/15/17 0600 07/16/17 0500 07/17/17 0500   Weight: 134.9 kg (297 lb 6.4 oz) (!) 137.2 kg (302 lb 7.5 oz) (!) 137.1 kg (302 lb 3.2 oz)       Intake/Output Summary:  (Last 24 hours)    Intake/Output Summary (Last 24 hours) at 07/17/17 0902  Last data filed at 07/17/17 0750   Gross per 24 hour   Intake              998 ml   Output              875 ml   Net              123 ml      Stool Occurrence: 1    Lab Review  24-hour labs:    Results for orders placed or performed during the hospital encounter of 07/10/17 (from the past 24 hour(s))   POC GLUCOSE    Collection Time: 07/16/17 12:21 PM   Result Value Ref Range    Glucose, POC 275 (H) 70 - 100 MG/DL   POC GLUCOSE    Collection Time: 07/16/17  4:43 PM   Result Value Ref Range    Glucose, POC 271 (H) 70 - 100 MG/DL   POC GLUCOSE    Collection Time: 07/16/17  9:20 PM   Result Value Ref Range    Glucose, POC 361 (H) 70 - 100 MG/DL   CBC    Collection Time: 07/17/17  4:25 AM   Result Value Ref Range    White Blood Cells 20.7 (H) 4.5 - 11.0 K/UL    RBC 3.54 (L) 4.4 - 5.5 M/UL    Hemoglobin 10.9 (L) 13.5 - 16.5 GM/DL    Hematocrit 16.1 (L) 40 - 50 %    MCV 92.2 80 - 100 FL    MCH 30.7 26 - 34 PG    MCHC 33.3 32.0 - 36.0 G/DL    RDW 09.6 (H)  11 - 15 %    Platelet Count 265 150 - 400 K/UL    MPV 9.0 7 - 11 FL   BASIC METABOLIC PANEL Collection Time: 07/17/17  4:25 AM   Result Value Ref Range    Sodium 129 (L) 137 - 147 MMOL/L    Potassium 4.3 3.5 - 5.1 MMOL/L    Chloride 97 (L) 98 - 110 MMOL/L    CO2 20 (L) 21 - 30 MMOL/L    Anion Gap 12 3 - 12    Glucose 205 (H) 70 - 100 MG/DL    Blood Urea Nitrogen 19 7 - 25 MG/DL    Creatinine 1.61 0.4 - 1.24 MG/DL    Calcium 9.1 8.5 - 09.6 MG/DL    eGFR Non African American >60 >60 mL/min    eGFR African American >60 >60 mL/min   POC GLUCOSE    Collection Time: 07/17/17  8:15 AM   Result Value Ref Range    Glucose, POC 295 (H) 70 - 100 MG/DL       Point of Care Testing  (Last 24 hours)  Glucose: (!) 205 (07/17/17 0425)  POC Glucose (Download): (!) 295 (07/17/17 0815)    Radiology and other Diagnostics Review:    Pertinent radiology reviewed.

## 2017-07-17 NOTE — Progress Notes
Assumed care at 1900. Assessment complete and documented per Doc Flowsheet. SR on tele; AV wires capped. VSS per pt. trend. AOx4. Pt. complaining of incisional pain; Tramadol and Tylenol given. Incisions C/D/I. Pt. was voiding in toilet, not measuring until this RN arrived. Pt. now understands importance of voiding in urinal. No needs at this time. Call light within reach, will continue to monitor.    No acute events overnight. Pt. pain increased due to increased coughing overnight but pain well controlled with current pain medication regimen. Pt. had adequate urine output for shift but still no BM. RN will continue to monitor.

## 2017-07-17 NOTE — Progress Notes
RESPIRATORY THERAPY  ADULT PROTOCOL EVALUATION      RESPIRATORY PROTOCOL PLAN    Medications  Albuterol: MDI PRN    Note: If indicated by protocol, medication orders will be placed by therapist.    Procedures  PEP Therapy: Place a nursing order for IS Q1h While Awake for any of Lung Expansion indicators  PAP: Place a nursing order for IS Q1h While Awake for any of Lung Expansion indicators  Oxygen/Humidity: O2 to keep SpO2 > 92%  Monitoring: Pulse oximetry BID & PRN       _____________________________________________________________    PATIENT EVALUATION RESULTS    Chart Review  * Pulmonary Hx: Hx pulmonary disease, hx reactive or obstructive airway disease (PEFR & AM) OR regular home use of bronchodilators (AM) OR inhaled or systemic steroid use for lungs < or equal to 4 times/yr (AM)    * Surgical Hx: Thoracic or abdominal surgery with reactive or obstructive airway disease (LE) (AM)    * Chest X-Ray: Clear OR not available    * PFT/Oxygenation: FEV1, PEFR > 80% predicted OR physically unable to perform OR Pa02 >80 RA OR Sp02 >95% RA      Patient Assessment  * Respiratory Pattern: Regular pattern and rate OR good chest excursion with deep breathing    * Breath Sounds: Clear and able to auscultate bases posteriorly    * Cough / Sputum: Strong, effective cough OR nonproductive    * Mental Status: Alert, oriented, cooperative    * Activity Level: Ambulatory with assistance      Priority Index  Total Points: 8 Points  * Priority Index: 1+    PRIORITY INDEX GUIDELINES*  Priority Points   1 0-9 points   2 9-18 points   3 > 18 points   + Pulm Dx or Home Rx   *Higher points indicate higher acuity.      Therapist: Dorothy Puffer, RT  Date: 07/17/2017      Key  AC = Airway clearance  AM = Aerosolized medication  BA = Bland aerosol  DB&C = Deep breathe & cough  FEV1 = Forced expiratory volume in first second)  IC = Inspiratory capacity  LE = Lung expansion  MDI = Metered dose inhaler  Neb = Nebulizer  O2 = Oxygen Oxim =Oximetry  PEFR = Peak expiratory flow rate  RRT = Rapid Response Team

## 2017-07-17 NOTE — Progress Notes
Pt SR on tele. Pt c/o pain this am. Oxycodone & tramadol given per orders. See mar. Pt up with assist x1. Pt up to chair most of shift. Pt had good appetite at meals. Pt's family at bedside this afternoon. Pt resting in chair at this time. Will continue to monitor.

## 2017-07-18 ENCOUNTER — Inpatient Hospital Stay: Admit: 2017-07-18 | Discharge: 2017-07-18 | Payer: BC Managed Care – PPO

## 2017-07-18 ENCOUNTER — Encounter: Admit: 2017-07-18 | Discharge: 2017-07-18 | Payer: BC Managed Care – PPO

## 2017-07-18 DIAGNOSIS — R0789 Other chest pain: ICD-10-CM

## 2017-07-18 LAB — CBC: Lab: 17 K/UL — ABNORMAL HIGH (ref 4.5–11.0)

## 2017-07-18 LAB — POC GLUCOSE
Lab: 176 mg/dL — ABNORMAL HIGH (ref 70–100)
Lab: 69 mg/dL — ABNORMAL LOW (ref 70–100)
Lab: 91 mg/dL (ref 70–100)
Lab: 232 mg/dL — ABNORMAL HIGH (ref 70–100)
Lab: 265 mg/dL — ABNORMAL HIGH (ref 70–100)
Lab: 94 mg/dL (ref 70–100)

## 2017-07-18 LAB — BASIC METABOLIC PANEL: Lab: 131 MMOL/L — ABNORMAL LOW (ref 137–147)

## 2017-07-18 MED ORDER — INSULIN GLARGINE 100 UNIT/ML (3 ML) SC INJ PEN
52 [IU] | Freq: Every day | SUBCUTANEOUS | 0 refills | Status: DC
Start: 2017-07-18 — End: 2017-07-18

## 2017-07-18 MED ORDER — METOPROLOL TARTRATE 25 MG PO TAB
25 mg | Freq: Two times a day (BID) | ORAL | 0 refills | Status: DC
Start: 2017-07-18 — End: 2017-07-19
  Administered 2017-07-18 – 2017-07-19 (×2): 25 mg via ORAL

## 2017-07-18 MED ORDER — ACETAMINOPHEN 650 MG RE SUPP
650 mg | RECTAL | 0 refills | Status: DC | PRN
Start: 2017-07-18 — End: 2017-07-20

## 2017-07-18 MED ORDER — ACETAMINOPHEN 325 MG PO TAB
650 mg | ORAL | 0 refills | Status: DC | PRN
Start: 2017-07-18 — End: 2017-07-20
  Administered 2017-07-19 – 2017-07-20 (×2): 650 mg via ORAL

## 2017-07-18 MED ORDER — INSULIN GLARGINE 100 UNIT/ML (3 ML) SC INJ PEN
56 [IU] | Freq: Every day | SUBCUTANEOUS | 0 refills | Status: DC
Start: 2017-07-18 — End: 2017-07-19

## 2017-07-18 NOTE — Progress Notes
Assumed care of patient at 2300.  SR on tele. VSS per trend. AOx4.      No bowel movement through the night.  Patient states that he will try MOM in the am.      BS 69 @ 0410; hypoglycemic protocol followed.

## 2017-07-19 LAB — CBC
Lab: 10 g/dL — ABNORMAL LOW (ref 13.5–16.5)
Lab: 15 % — ABNORMAL HIGH (ref 11–15)
Lab: 15 K/UL — ABNORMAL HIGH (ref 4.5–11.0)
Lab: 3.5 M/UL — ABNORMAL LOW (ref 4.4–5.5)
Lab: 30 pg (ref 26–34)
Lab: 32 % — ABNORMAL LOW (ref 40–50)
Lab: 323 10*3/uL (ref >60)
Lab: 33 g/dL (ref 32.0–36.0)
Lab: 7.6 FL (ref >60)
Lab: 92 FL — ABNORMAL HIGH (ref 80–100)

## 2017-07-19 LAB — POC GLUCOSE
Lab: 124 mg/dL — ABNORMAL HIGH (ref 70–100)
Lab: 66 mg/dL — ABNORMAL LOW (ref >60)
Lab: 99 mg/dL (ref 70–100)
Lab: 198 mg/dL — ABNORMAL HIGH (ref 70–100)
Lab: 294 mg/dL — ABNORMAL HIGH (ref 70–100)
Lab: 239 mg/dL — ABNORMAL HIGH (ref 70–100)
Lab: 81 mg/dL (ref 70–100)

## 2017-07-19 LAB — BASIC METABOLIC PANEL: Lab: 132 MMOL/L — ABNORMAL LOW (ref 137–147)

## 2017-07-19 MED ORDER — METOPROLOL TARTRATE 25 MG PO TAB
37.5 mg | Freq: Two times a day (BID) | ORAL | 0 refills | Status: DC
Start: 2017-07-19 — End: 2017-07-20
  Administered 2017-07-19 – 2017-07-20 (×2): 37.5 mg via ORAL

## 2017-07-19 MED ORDER — FUROSEMIDE 10 MG/ML IJ SOLN
40 mg | Freq: Once | INTRAVENOUS | 0 refills | Status: CP
Start: 2017-07-19 — End: ?
  Administered 2017-07-19: 16:00:00 40 mg via INTRAVENOUS

## 2017-07-19 MED ORDER — INSULIN GLARGINE 100 UNIT/ML (3 ML) SC INJ PEN
48 [IU] | Freq: Every day | SUBCUTANEOUS | 0 refills | Status: DC
Start: 2017-07-19 — End: 2017-07-20

## 2017-07-19 MED ORDER — HEPARIN, PORCINE (PF) 5,000 UNIT/0.5 ML IJ SYRG
5000 [IU] | SUBCUTANEOUS | 0 refills | Status: DC
Start: 2017-07-19 — End: 2017-07-20
  Administered 2017-07-19 – 2017-07-20 (×4): 5000 [IU] via SUBCUTANEOUS

## 2017-07-19 MED ORDER — INSULIN ASPART 100 UNIT/ML SC FLEXPEN
18 [IU] | Freq: Three times a day (TID) | SUBCUTANEOUS | 0 refills | Status: DC
Start: 2017-07-19 — End: 2017-07-20

## 2017-07-19 MED ORDER — POTASSIUM CHLORIDE 20 MEQ PO TBTQ
20 meq | Freq: Once | ORAL | 0 refills | Status: CP
Start: 2017-07-19 — End: ?
  Administered 2017-07-19: 16:00:00 20 meq via ORAL

## 2017-07-19 NOTE — Progress Notes
03:56 BS 99, pt states he feels better. Will continue to monitor.     03:15 BS 66, pt states he feels weak. VSS. Orange juice given and graham crackers given. Will recheck BS in 15 mins per hypoglycemic protocol

## 2017-07-19 NOTE — Progress Notes
Notified Gregory BraySarah Acosta pt concerned about BLE 1+ edema and requesting to take morning Lasix 40 mg PO now. Received ok.

## 2017-07-19 NOTE — Progress Notes
CARDIOTHORACIC SURGERY DAILY PROGRESS NOTE    PROCEDURE: BYPASS GRAFT CORONARY ARTERYx4 , LIMA, LEVH    POD #: 5    SUBJECTIVE:   Overnight events: hypoglycemic this am, BS 66.       ASSESSMENT:  Principal Problem:    Unstable angina (HCC)  Active Problems:    Type 2 diabetes mellitus without complication, without long-term current use of insulin (HCC)    Essential hypertension    Mixed hyperlipidemia    Obstructive sleep apnea syndrome    Obesity    Coronary artery disease involving native coronary artery of native heart    Chronic diastolic CHF (congestive heart failure), NYHA class 2 (HCC)      PLAN:  Neuro ??? Pain controlled on current regimen. Continue PRN oxycodone with scheduled Tylenol. Ultram added prn.   CV ??? SR, rates 80s-90s. SBP 110-130s. Increase metoprolol to 37.5 bid (takes 50 bid PTA). Continue ASA, statin. Intra op TEE LVEF 50%.  Resp ??? 2 view CXR ??? Lungs expanded, right hemidiaphragm persistently elevated pre-op. O2 sats stable on RA. Cont PTA CPAP at night. Cont aggressive pulm toilet.   Renal ??? sCR 0.80-->0.69. UOP not accurate. Continue PTA Lasix 40mg  QD. AT pre-op weight.  GI - Cardiac/diabetic diet. Last BM 7/22, continue post op bowel regimen-add Miralax.   ID ??? Afebrile. WBC trending down 21.7-->20.7-->17.2-->15.9, Pt on steroids prior to cardiac cath for dye allergy. Last dose 60mg  prednisone 7/16. Cont to monitor WBC.   Heme ??? Hgb 10.9. Heparin DVT prophylaxis, continue mechanical prophylaxis.   FEN ??? type 2 DM,  A1c 10.9% on home regimen. BS 66-260. Symptomatic with low BS. Endocrine following and adjusting insulin. FSBS uncontrolled.   Activity ??? Patient walking 250 feet. Increase ambulation today.   Disposition - increase metoprolol, give extra dose lasix. Await endo recs re: insulin. Home tomorrow if BS stable.     OBJECTIVE:  Vitals:    07/18/17 2022 07/18/17 2300 07/19/17 0300 07/19/17 0509   BP:  115/77 137/90    Pulse: 99 85 81    Temp:  36.5 ???C (97.7 ???F) 36.4 ???C (97.5 ???F) SpO2: 96% 96% 95%    Weight:    (!) 137.2 kg (302 lb 6.4 oz)   Height:           Physical Exam:  General: A&O x 3, NAD  Cardiovascular: RRR no rub or murmur  Respiratory: LS CTA bil  GI: obese, soft, NT, +BS  Extremities: 1+ bilateral LE edema  Incisions: clean, dry, intact    Prophylaxis Review:  Lines:  No  Antibiotic Usage:  No  VTE:  Pharmacological prophylaxis; SQ Heparin and Mechanical prophylaxis; Sequential compression device  Urinary Catheter: No      Pertinent Meds:               Taking    Reason for Not Taking  1. Aspirin yes    2. B-Blocker yes    3. Statin yes    4. ACE/ARB no EF 50%       LABS:  Lab Results   Component Value Date/Time    WBC 15.9 (H) 07/19/2017 04:02 AM    HGB 10.9 (L) 07/19/2017 04:02 AM    HCT 32.8 (L) 07/19/2017 04:02 AM    PLTCT 323 07/19/2017 04:02 AM          Lab Results   Component Value Date/Time    NA 132 (L) 07/19/2017 04:02 AM    K 4.1 07/19/2017 04:02  AM    CL 98 07/19/2017 04:02 AM    CO2 28 07/19/2017 04:02 AM    BUN 18 07/19/2017 04:02 AM    CR 0.69 07/19/2017 04:02 AM    GLU 139 (H) 07/19/2017 04:02 AM    Lab Results   Component Value Date    MG 3.3 (H) 07/14/2017     No results found for: PO4        Lab Results   Component Value Date    GLUPOC 99 07/19/2017    GLUPOC 66 (L) 07/19/2017    GLUPOC 124 (H) 07/18/2017    GLUPOC 94 07/18/2017    GLUPOC 265 (H) 07/18/2017    GLUPOC 232 (H) 07/18/2017    GLUPOC 91 07/18/2017    GLUPOC 69 (L) 07/18/2017       Shanda Bumps, PA-C  [phone]

## 2017-07-19 NOTE — Progress Notes
Assumed pt care at 0700. Assessments complete. A&Ox4. VSS. Tolerating RA. NSR on tele. Pain controlled with current regimen. Adequate UOP. +BM. Incisions C/D/I. Pt up ad lib, steady gait. No other needs expressed at this time. Call light within pt's reach. Will continue to monitor.

## 2017-07-19 NOTE — Progress Notes
Hypoglycemic overnight into 60s.  This morning complains of persistent swelling in his legs--currently on daily Lasix.  Denies pain, breathing without difficulty.  Endocrine following for DM management--appreciate their assistance.  Cr stable--will aggressively diurese today and encourage mobilization as able.      Oneal GroutAshley Jahniya Duzan MD

## 2017-07-19 NOTE — Case Management (ED)
Case Management Progress Note    NAME:Gregory Acosta                          MRN: [id number]              DOB:1956-05-17          AGE: 61 y.o.  ADMISSION DATE: 07/10/2017             DAYS ADMITTED: LOS: 9 days      Todays Date: 07/19/2017    Plan  Increase metoprolol, give extra dose lasix. Await endo recs re: insulin. Home tomorrow if BS stable.       Interventions  ? Support   Support: Pt/Family Updates re:POC or DC Plan, Patient Education  ? Info or Referral      ? Discharge Planning   EMR and POC reviewed   NCM met w pt at the bedside to finalize dc plans.    Patient comfortable with post op recovery and discussed importance of increasing activity, monitoring incisions, s/s of infection, monitor bowel function, pain control and encouraged calling for questions/concerns.  Role of [place] reviewed and patient denies need for same.     Pt reports that his spouse is able to provide post dc support/transport and denied any further needs at this time.    CM team to continue to following in dc planning/case progression .    ? Medication Needs      ? Financial      ? Legal   Legal: [name]  ? Other        Disposition  ? Expected Discharge Date    Expected Discharge Date: 07/19/17  ? Transportation   Does the patient need discharge transport arranged?: No  Transportation Name, Phone and Availability #1: [name] (wife) [phone]  Does the patient use Medicaid Transportation?: No  ? Discharge Disposition   Home w self care and family assist.     [name] BSN, RN  Integrated Nurse Case Manager  Inpatient Cardiothoracic Surgery   M-F [phone]  O: [phone]  Pg: [phone]

## 2017-07-19 NOTE — Progress Notes
Assumed care at 1900  Assessments completed and documented via doc flowhsheets  AOx4, on RA, VSS per trend, SR on tele  Pt. reports adequate pain control with PRN Tramadol   Incisions CDI  UOP adequate and last BM 7/22  No further needs at this time  Will continue to monitor

## 2017-07-19 NOTE — Progress Notes
Endocrinology Progress Note      Name:  Gregory Acosta   Today's Date:  07/19/2017  Admission Date: 07/10/2017  LOS: 9 days         Principal Problem:    Unstable angina (HCC)  Active Problems:    Type 2 diabetes mellitus without complication, without long-term current use of insulin (HCC)    Essential hypertension    Mixed hyperlipidemia    Obstructive sleep apnea syndrome    Obesity    Coronary artery disease involving native coronary artery of native heart    Chronic diastolic CHF (congestive heart failure), NYHA class 2 (HCC)                   Assessment/Plan:        Gregory Acosta???is a 61 y.o. male with chest pain, HTN, HLD, PAD, RLS, diabetes type 2, spinal stenosis, OSA on CPAP, obesity, who was admitted for chest pain rule out endocrinology has been consulted for diabetes management in the setting of high PTA insulin and no insurance.  ???  Type 2???Diabetes Mellitus, uncontrolled  A1c 10.9, uncontrolled  PTA regimen: levemir 80???units BID???and humalog 50???units TID w/ meals, as well as metformin and Glimepiride  Hypoglycemic episodes on this regimen: rare 3-4 times yearly, misses a lot of meal time dosing  Follows up with Dr. [name]???for diabetes management  Diabetic-complications assessment:  ???????????????????????????????????????Retinopathy: yes.   ???????????????????????????????????????Peripheral neuropathy: yes  ???????????????????????????????????????Autonomic neuropathy: no  ???????????????????????????????????????Nephropathy: no.   ???????????????????????????????????????Macrovascular complications: Yes???known CAD  Risk Factor assessment  ???????????????????????????????????????Last lipid profile 07/11/2017  ??????????????????????????????????????????????????????????????????????????????LDL 204???(goal <70)  ??????????????????????????????????????????????????????????????????????????????TG 329???(goal <150)  ???????????????????????????????????????BP at goal (goal <???140/80)  On ACEi/ARB?: Losartan  On Statin?: intolerance  ???  Chest pain  ???  PAD  HLD  HTN: Losartan  RLS  OSA on CPAP  ???  Impression / Recommendations    Dec Lantus 48 daily  Inc Aspart 18 unit post meal  MDCF    Pt was discussed with Dr. Chryl Heck, DO   Pager [phone]     Subjective Gregory Acosta is a 61 y.o. male.  Patient feeling well today with good appetite. No complaint or acute events overnight.    Denies fever, chills, nausea, vomiting    Physical Exam    General:  Alert, cooperative, no distress, appears stated age  Head:  Normocephalic, atraumatic  Eyes:  EOMI  Lungs:  Non-labored  Abdomen:  Obese abdomen  Skin:   No rashes  Extremities: No cyanosis   Neurologic:  No focal neurologic deficits  Psych: appropriate affect    Medications  Scheduled Meds:    aspirin chewable tablet 81 mg 81 mg Oral QDAY   ezetimibe (ZETIA) tablet 10 mg 10 mg Oral QDAY   fluticasone (FLOVENT HFA) 110 mcg/actuation inhaler 2 puff 2 puff Inhalation BID   furosemide (LASIX) tablet 40 mg 40 mg Oral QDAY   heparin (porcine) PF syringe 5,000 Units 5,000 Units Subcutaneous Q8H   insulin aspart U-100 (NOVOLOG FLEXPEN) injection PEN 0-14 Units 0-14 Units Subcutaneous ACHS   insulin aspart U-100 (NOVOLOG FLEXPEN) injection PEN 18 Units 18 Units Subcutaneous TID w/ meals   insulin glargine (LANTUS SOLOSTAR, BASAGLAR) injection PEN 48 Units 48 Units Subcutaneous QDAY(12)   metoprolol tartrate (LOPRESSOR) tablet 37.5 mg 37.5 mg Oral BID   polyethylene glycol 3350 (MIRALAX) packet 17 g 1 packet Oral QDAY   pramipexole (MIRAPEX) tablet 0.25 mg 0.25 mg Oral QHS   rosuvastatin (CRESTOR) tablet 10 mg 10 mg  Oral Once per day on Mon Thu   senna/docusate (SENOKOT-S) tablet 2 tablet 2 tablet Oral BID   Continuous Infusions:    PRN and Respiratory Meds:acetaminophen Q6H PRN **OR** acetaminophen Q6H PRN, albuterol Q4H PRN, alum/mag hydroxide/simeth Q4H PRN, bisacodyl QDAY PRN, hydrALAZINE Q6H PRN, lidocaine PF PRN, milk of magnesia (CONC) QDAY PRN, ondansetron Q6H PRN **OR** ondansetron (ZOFRAN) IV Q6H PRN, oxyCODONE Q3H PRN, potassium chloride SR PRN **OR** potassium chloride PRN **OR** potassium chloride in water PRN, pramipexole QDAY PRN, traMADol Q6H PRN      Objective Vital Signs: Last Filed                 Vital Signs: 24 Hour Range   BP: 147/86 (07/23 1021)  Temp: 36.7 ???C (98 ???F) (07/23 1021)  Pulse: 82 (07/23 1021)  Respirations: 18 PER MINUTE (07/23 1021)  SpO2: 98 % (07/23 1021)  O2 Delivery: None (Room Air) (07/23 1021) BP: (115-147)/(72-90)   Temp:  [36.4 ???C (97.5 ???F)-37.2 ???C (98.9 ???F)]   Pulse:  [81-101]   Respirations:  [16 PER MINUTE-19 PER MINUTE]   SpO2:  [95 %-100 %]   O2 Delivery: None (Room Air)   Intensity Pain Scale 0-10 (Pain 1): 4 (07/19/17 1051) Vitals:    07/17/17 0500 07/18/17 0600 07/19/17 0509   Weight: (!) 137.1 kg (302 lb 3.2 oz) (!) 137.3 kg (302 lb 12.8 oz) (!) 137.2 kg (302 lb 6.4 oz)       Intake/Output Summary:  (Last 24 hours)    Intake/Output Summary (Last 24 hours) at 07/19/17 1245  Last data filed at 07/19/17 1051   Gross per 24 hour   Intake              850 ml   Output             1800 ml   Net             -950 ml      Stool Occurrence: 1    Lab Review  24-hour labs:    Results for orders placed or performed during the hospital encounter of 07/10/17 (from the past 24 hour(s))   POC GLUCOSE    Collection Time: 07/18/17  5:00 PM   Result Value Ref Range    Glucose, POC 94 70 - 100 MG/DL   POC GLUCOSE    Collection Time: 07/18/17  8:51 PM   Result Value Ref Range    Glucose, POC 124 (H) 70 - 100 MG/DL   POC GLUCOSE    Collection Time: 07/19/17  3:15 AM   Result Value Ref Range    Glucose, POC 66 (L) 70 - 100 MG/DL   POC GLUCOSE    Collection Time: 07/19/17  3:56 AM   Result Value Ref Range    Glucose, POC 99 70 - 100 MG/DL   BASIC METABOLIC PANEL    Collection Time: 07/19/17  4:02 AM   Result Value Ref Range    Sodium 132 (L) 137 - 147 MMOL/L    Potassium 4.1 3.5 - 5.1 MMOL/L    Chloride 98 98 - 110 MMOL/L    CO2 28 21 - 30 MMOL/L    Anion Gap 6 3 - 12    Glucose 139 (H) 70 - 100 MG/DL    Blood Urea Nitrogen 18 7 - 25 MG/DL    Creatinine 1.47 0.4 - 1.24 MG/DL    Calcium 8.7 8.5 - 82.9 MG/DL eGFR Non African American >60 >60 mL/min  eGFR African American >60 >60 mL/min   CBC    Collection Time: 07/19/17  4:02 AM   Result Value Ref Range    White Blood Cells 15.9 (H) 4.5 - 11.0 K/UL    RBC 3.53 (L) 4.4 - 5.5 M/UL    Hemoglobin 10.9 (L) 13.5 - 16.5 GM/DL    Hematocrit 41.3 (L) 40 - 50 %    MCV 92.9 80 - 100 FL    MCH 30.9 26 - 34 PG    MCHC 33.3 32.0 - 36.0 G/DL    RDW 24.4 (H) 11 - 15 %    Platelet Count 323 150 - 400 K/UL    MPV 7.6 7 - 11 FL   POC GLUCOSE    Collection Time: 07/19/17  7:31 AM   Result Value Ref Range    Glucose, POC 198 (H) 70 - 100 MG/DL   POC GLUCOSE    Collection Time: 07/19/17 10:21 AM   Result Value Ref Range    Glucose, POC 294 (H) 70 - 100 MG/DL   POC GLUCOSE    Collection Time: 07/19/17 12:17 PM   Result Value Ref Range    Glucose, POC 239 (H) 70 - 100 MG/DL       Point of Care Testing  (Last 24 hours)  Glucose: (!) 139 (07/19/17 0402)  POC Glucose (Download): (!) 239 (07/19/17 1217)    Radiology and other Diagnostics Review:    Pertinent radiology reviewed.

## 2017-07-19 NOTE — Progress Notes
OCCUPATIONAL THERAPY  DISCHARGE    Chart reviewed and case discussed with RN. RN reports pt up ad lib and independent with basic ADLs and functional transfers.    Patient denies changes from baseline ADLs and functional mobility and reports no history of balance loss or falls in the last three months.  Patient has had surgery that may make sock/shoe management difficult but denies concern for these activities.  Patient has not demonstrated or reported new difficulties with vision when completing functional tasks.  Patient endorses no concerns with functional skills at home.  Currently, the patient is ambulating (in room/ on unit) without difficulty.    Encouraged patient to continue to perform functional skills/ADLs while in the hospital and discussed the ability for the patient to complete their ADLs with the bedside nursing staff.  Occupational therapy services will be discontinued at this time, please re-consult if the patient has a change in functional status.      Lauro RegulusElizabeth Melvina Pangelinan, OTR/L [phone]

## 2017-07-19 NOTE — Progress Notes
PHYSICAL THERAPY  NOTE         The patient currently up ab lib - walking laps independently and no longer using roller walker. PT to discontinue services at this time. Please reorder if change in functional status occurs.    Therapist: Elvina SidleAngela Petrina Melby, PT  Date: 07/19/2017

## 2017-07-20 ENCOUNTER — Emergency Department: Admit: 2017-07-10 | Discharge: 2017-07-10 | Payer: BC Managed Care – PPO

## 2017-07-20 ENCOUNTER — Inpatient Hospital Stay: Admit: 2017-07-12 | Discharge: 2017-07-12 | Payer: BC Managed Care – PPO

## 2017-07-20 ENCOUNTER — Encounter: Admit: 2017-07-20 | Discharge: 2017-07-20 | Payer: BC Managed Care – PPO

## 2017-07-20 ENCOUNTER — Inpatient Hospital Stay: Admit: 2017-07-14 | Discharge: 2017-07-14 | Payer: BC Managed Care – PPO

## 2017-07-20 ENCOUNTER — Inpatient Hospital Stay: Admit: 2017-07-13 | Discharge: 2017-07-13 | Payer: BC Managed Care – PPO

## 2017-07-20 ENCOUNTER — Inpatient Hospital Stay: Admit: 2017-07-17 | Discharge: 2017-07-17 | Payer: BC Managed Care – PPO

## 2017-07-20 ENCOUNTER — Inpatient Hospital Stay: Admit: 2017-07-15 | Discharge: 2017-07-15 | Payer: BC Managed Care – PPO

## 2017-07-20 ENCOUNTER — Inpatient Hospital Stay
Admit: 2017-07-10 | Discharge: 2017-07-20 | Disposition: A | Discharge status: Home / Self Care | Payer: BC Managed Care – PPO | Admitting: Cardiovascular Disease

## 2017-07-20 DIAGNOSIS — E1165 Type 2 diabetes mellitus with hyperglycemia: ICD-10-CM

## 2017-07-20 DIAGNOSIS — Z87891 Personal history of nicotine dependence: ICD-10-CM

## 2017-07-20 DIAGNOSIS — E1142 Type 2 diabetes mellitus with diabetic polyneuropathy: ICD-10-CM

## 2017-07-20 DIAGNOSIS — I2582 Chronic total occlusion of coronary artery: ICD-10-CM

## 2017-07-20 DIAGNOSIS — J449 Chronic obstructive pulmonary disease, unspecified: ICD-10-CM

## 2017-07-20 DIAGNOSIS — Z794 Long term (current) use of insulin: ICD-10-CM

## 2017-07-20 DIAGNOSIS — E11649 Type 2 diabetes mellitus with hypoglycemia without coma: ICD-10-CM

## 2017-07-20 DIAGNOSIS — T380X5A Adverse effect of glucocorticoids and synthetic analogues, initial encounter: ICD-10-CM

## 2017-07-20 DIAGNOSIS — G4733 Obstructive sleep apnea (adult) (pediatric): ICD-10-CM

## 2017-07-20 DIAGNOSIS — G2581 Restless legs syndrome: ICD-10-CM

## 2017-07-20 DIAGNOSIS — M4800 Spinal stenosis, site unspecified: ICD-10-CM

## 2017-07-20 DIAGNOSIS — D72828 Other elevated white blood cell count: ICD-10-CM

## 2017-07-20 DIAGNOSIS — E11319 Type 2 diabetes mellitus with unspecified diabetic retinopathy without macular edema: ICD-10-CM

## 2017-07-20 DIAGNOSIS — E871 Hypo-osmolality and hyponatremia: ICD-10-CM

## 2017-07-20 DIAGNOSIS — E1151 Type 2 diabetes mellitus with diabetic peripheral angiopathy without gangrene: ICD-10-CM

## 2017-07-20 DIAGNOSIS — D62 Acute posthemorrhagic anemia: ICD-10-CM

## 2017-07-20 DIAGNOSIS — Z79899 Other long term (current) drug therapy: ICD-10-CM

## 2017-07-20 DIAGNOSIS — E782 Mixed hyperlipidemia: ICD-10-CM

## 2017-07-20 DIAGNOSIS — Z6841 Body Mass Index (BMI) 40.0 and over, adult: ICD-10-CM

## 2017-07-20 DIAGNOSIS — R578 Other shock: ICD-10-CM

## 2017-07-20 DIAGNOSIS — Z91041 Radiographic dye allergy status: ICD-10-CM

## 2017-07-20 DIAGNOSIS — J9811 Atelectasis: ICD-10-CM

## 2017-07-20 DIAGNOSIS — I110 Hypertensive heart disease with heart failure: ICD-10-CM

## 2017-07-20 DIAGNOSIS — Z8249 Family history of ischemic heart disease and other diseases of the circulatory system: ICD-10-CM

## 2017-07-20 DIAGNOSIS — E872 Acidosis: ICD-10-CM

## 2017-07-20 DIAGNOSIS — Z7982 Long term (current) use of aspirin: ICD-10-CM

## 2017-07-20 DIAGNOSIS — I25110 Atherosclerotic heart disease of native coronary artery with unstable angina pectoris: Principal | ICD-10-CM

## 2017-07-20 DIAGNOSIS — E876 Hypokalemia: ICD-10-CM

## 2017-07-20 DIAGNOSIS — I5032 Chronic diastolic (congestive) heart failure: ICD-10-CM

## 2017-07-20 DIAGNOSIS — E875 Hyperkalemia: ICD-10-CM

## 2017-07-20 LAB — POC GLUCOSE
Lab: 134 mg/dL — ABNORMAL HIGH (ref 70–100)
Lab: 55 mg/dL — ABNORMAL LOW (ref 70–100)
Lab: 78 mg/dL (ref 70–100)
Lab: 303 mg/dL — ABNORMAL HIGH (ref 70–100)
Lab: 289 mg/dL — ABNORMAL HIGH (ref 70–100)
Lab: 236 mg/dL — ABNORMAL HIGH (ref 70–100)

## 2017-07-20 LAB — CBC
Lab: 14 K/UL — ABNORMAL HIGH (ref >60)
Lab: 3.5 M/UL — ABNORMAL LOW (ref >60)

## 2017-07-20 LAB — BASIC METABOLIC PANEL: Lab: 134 MMOL/L — ABNORMAL LOW (ref 137–147)

## 2017-07-20 MED ORDER — METFORMIN 1,000 MG PO TAB
500 mg | ORAL_TABLET | Freq: Two times a day (BID) | ORAL | 3 refills | Status: AC
Start: 2017-07-20 — End: ?
  Filled 2017-07-20 (×2): qty 180, 30d supply, fill #1

## 2017-07-20 MED ORDER — EZETIMIBE 10 MG PO TAB
10 mg | ORAL_TABLET | Freq: Every day | ORAL | 3 refills | Status: AC
Start: 2017-07-20 — End: 2019-03-27
  Filled 2017-07-20 (×2): qty 90, 30d supply, fill #1

## 2017-07-20 MED ORDER — METOPROLOL TARTRATE 25 MG PO TAB
25 mg | Freq: Two times a day (BID) | ORAL | 0 refills | Status: DC
Start: 2017-07-20 — End: 2017-07-20
  Administered 2017-07-20: 15:00:00 25 mg via ORAL

## 2017-07-20 MED ORDER — LOSARTAN 50 MG PO TAB
50 mg | Freq: Every day | ORAL | 0 refills | Status: DC
Start: 2017-07-20 — End: 2017-07-20
  Administered 2017-07-20: 15:00:00 50 mg via ORAL

## 2017-07-20 MED ORDER — SENNOSIDES-DOCUSATE SODIUM 8.6-50 MG PO TAB
2 | ORAL_TABLET | Freq: Two times a day (BID) | ORAL | 0 refills | Status: AC
Start: 2017-07-20 — End: 2018-02-08

## 2017-07-20 MED ORDER — OXYCODONE-ACETAMINOPHEN 5-325 MG PO TAB
1 | ORAL_TABLET | ORAL | 0 refills | 2.00000 days | Status: AC | PRN
Start: 2017-07-20 — End: 2017-07-20
  Filled 2017-07-20: qty 40, 7d supply

## 2017-07-20 MED ORDER — INSULIN LISPRO 100 UNIT/ML SC INPN
18 [IU] | Freq: Three times a day (TID) | SUBCUTANEOUS | 0 refills | 39.00000 days | Status: AC
Start: 2017-07-20 — End: 2017-08-23

## 2017-07-20 MED ORDER — INSULIN GLARGINE 100 UNIT/ML (3 ML) SC INJ PEN
40 [IU] | Freq: Every day | SUBCUTANEOUS | 0 refills | Status: DC
Start: 2017-07-20 — End: 2017-07-20

## 2017-07-20 MED ORDER — METOPROLOL TARTRATE 50 MG PO TAB
50 mg | Freq: Two times a day (BID) | ORAL | 0 refills | Status: DC
Start: 2017-07-20 — End: 2017-07-20

## 2017-07-20 MED ORDER — ROSUVASTATIN 10 MG PO TAB
10 mg | ORAL_TABLET | ORAL | 3 refills | 90.00000 days | Status: AC
Start: 2017-07-20 — End: 2017-10-28
  Filled 2017-07-20 (×2): qty 90, 28d supply, fill #1

## 2017-07-20 MED ORDER — TRAMADOL 50 MG PO TAB
50 mg | ORAL_TABLET | ORAL | 0 refills | Status: AC | PRN
Start: 2017-07-20 — End: 2018-02-08
  Filled 2017-07-20 (×2): qty 30, 8d supply, fill #1

## 2017-07-20 MED ORDER — INSULIN DETEMIR U-100 100 UNIT/ML SC SOLN
42 [IU] | Freq: Every morning | SUBCUTANEOUS | 12 refills | Status: AC
Start: 2017-07-20 — End: 2017-08-23
  Filled 2017-07-20 (×2): qty 10, 23d supply, fill #1

## 2017-07-20 MED ORDER — METOPROLOL TARTRATE 25 MG PO TAB
25 mg | ORAL_TABLET | Freq: Two times a day (BID) | ORAL | 3 refills | 90.00000 days | Status: AC
Start: 2017-07-20 — End: ?
  Filled 2017-07-20 (×2): qty 180, 30d supply, fill #1

## 2017-07-20 MED ORDER — METOPROLOL TARTRATE 25 MG PO TAB
25 mg | ORAL_TABLET | Freq: Two times a day (BID) | ORAL | 3 refills | Status: CN
Start: 2017-07-20 — End: ?

## 2017-07-20 MED ORDER — LOSARTAN 100 MG PO TAB
50 mg | ORAL_TABLET | Freq: Every day | ORAL | 3 refills | 30.00000 days | Status: AC
Start: 2017-07-20 — End: 2017-08-23
  Filled 2017-07-20 (×2): qty 90, 90d supply, fill #1

## 2017-07-20 NOTE — Progress Notes
1320 - Reviewed discharge instructions, prescriptions/medications, follow-up appt with pt and pt's family.  Pt states verbal understanding - no questions.  IV and Tele dc'd.  Medications will be brought to bedside by pharmacy concierge team when ready.  When ready, pt taken to the front door by hospital staff.

## 2017-07-20 NOTE — Consults
INPATIENT DIABETES EDUCATION TEAM ???[facility]    Reason for Consult: Changes to home insulin regimen    Discussed Consult with Primary Team: Linwood Dibbles, RN    Patient may benefit from:     This consult team does not write orders. Primary Team is responsible for placing orders.    Supplies/Resources provided:   ??? Updated insulin schedule  ??? [facility] free classes    Met with Gregory Acosta to discuss diabetes management. Spouse, Gregory Acosta, at bedside. 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 Endocrinology consulted, following recommendations. Diabetes education consulted as patient has had insulin schedule changes. Discharge orders had been placed by primary team. Upon entering room, patient sitting up in chair. Introduced self and role.     Discussed with patient; handouts provided:   Diabetes disease process: Pt living with Type 2 Diabetes for 20 years.     Monitoring and glucose goals: Pt has a functioning meter with adequate supplies. Pt reports checking blood sugars 3-4x/day.      Glucose levels: Discussed goals of 70-140 prior to meals, or up to 180 random glucose level. Pt reports blood sugars are 60s-300s at home. Pt's wife also interested in [place]. Provided information (written and verbal) regarding the [facility] and where/how to purchase.     Medication: PTA medications include Levemir 80 units BID and Humalog 50 units TID. Pt has needed significantly less insulin during hospitalization. Provided pt with updated schedule, see below. Educated pt on sliding scale, and patient able to demonstrate understanding with practice scenarios.  Educated pt and wife on treat to target guidelines. Both able to demonstrate understanding with practice scenarios. Of note, patient does not have insurance coverage and has a [employer] manager through [employer]. Pt is able to purchase 3 vials of Levemir and 3 vials Humalog for $49/insulin. Pt reports this is affordable. Diet: Nutrition and consistent eating patterns: Pt currently eats 3 meals/day. Encouraged pt to eat 3 well balanced 30-60g carbohydrate meals/day and no more than 1 snack/day < 30g carbs.Reviewed paying close attention to total grams of carbohydrates, serving size, and servings per container. Discussed how different foods affect blood sugars (proteins, fats, and carbohydrates). Pt reports he and his wife have started a diet about 2-3 weeks ago. Upon further assessment, wife reports they have cut the carbs and consume about 20-45g CHO per meal. They have eliminated almost all starches (breads, pastas, potatoes, and rice) and have increased their vegetable and protein consumption. Pt and wife both report patient consumes way more carbs here in the hospital than at home. Educated pt and wife to pay close attention to blood sugar trends at home and to monitor for hypoglycemia closely once home. Educated pt and wife not to eliminate CHOs completely. Encouraged CHO consistent meals. Pt verbalized understanding.     Exercise: Encouraged daily physical activity. Pt reports the last few years he has struggled with energy levels and ability to exercise/be active r/t lung disease.     Hypoglycemia s/s, definition, prevention and treatment: Reviewed Rule of 15. Pt says he has 1-2 hypo events/ month and treats them with glucose tablets.    Hyperglycemia s/s, definition, prevention and treatment. Reviewed meaning of HbA1c, current A1c 10.9% and goal of < 7%. Pt reports his personal goal is <6%. Reviewed long term complications. Pt reports his previous A1c was 12% and feels encouraged to keep working at his diabetes towards his A1c goal.     Provider: Who to contact for abnormal results or emergencies,  follow up for diabetes mgmt: Instructed to call their diabetes provider for glucose consistently above 250 or below 70 after treatment.  Pt follows [facility] Alona Bene at [facility] Follow up will be in 1-2 weeks. Provided pt with phone number for [facility] as well.    Education plan: Provided pt with information regarding free classes offered at [facility].       Daily Schedule   Taking Insulin and Monitoring Your Blood Sugar     Morning:  Take your blood sugar & write down the results  Take mealtime insulin of 18 units HUMALOG PLUS per scale below  Take long acting insulin __42___ units of LEVEMIR   Eat meal within 15 minutes    Lunch:  Take your blood sugar & write down the results  Take mealtime insulin of 18 units HUMALOG PLUS per scale below  Eat meal within 15 minutes    Supper/Dinner:  Take your blood sugar & write down the results  Take mealtime insulin of 18 units HUMALOG PLUS per scale below  Eat meal within 15 minutes    If  your pre-meal blood sugar is: Give this correction factor insulin of HUMALOG    70 to 180 0   181 to 220 2   221 to 260 4   261 to 320 6   321 to 380 8    Greater than 380 10     Bedtime:  Take your blood sugar & write down the results    TREAT TO TARGET GUIDELINES:    Call MD if blood sugar is less than 70 (after treating) or greater than 250 for 2 days    Treat to Target Guidelines    Before Breakfast Blood Glucose Level Here???s How to Change Your Next LEVEMIR Dose   151mg /dL or Higher Increase by 2 units   80-150 mg/dL No Change   Less than 80 mg/dL or any low blood sugar in past 24 hours Decrease by 2 units       Increase your Levemir dose by 2 units every THREE DAYS until the before breakfast sugar remains between 80-150mg /dL. If your morning tests are higher than 250 mg/dL, consult your healthcare provider    If blood sugar is below 70, treat your low blood sugar with one serving of carbohydrate such as:   ? One cup of Skim Milk  ? ??? cup of Fruit Juice or Regular Soda  ? 4 Hard Candies  ? 3-4 Glucose Tablets     Recheck blood sugar in 15 minutes & retreat if not > 70.    All other questions and concerns addressed. Pt reports he is excited to go home. Appreciate the consult. Please contact diabetes educators with any additional questions or concerns.     Perley Jain, BSN, BS, RN, CPT  Diabetes Educator  Office: [phone]  Pager: [phone]  Diabetes Education Team Pager: [phone]  Diabetes Team Office: [phone]

## 2017-07-20 NOTE — Discharge Planning (AHS/AVS)
Daily Schedule   Taking Insulin and Monitoring Your Blood Sugar     Morning:  Take your blood sugar & write down the results  Take mealtime insulin of 18 units HUMALOG PLUS per scale below  Take long acting insulin __42___ units of LEVEMIR   Eat meal within 15 minutes    Lunch:  Take your blood sugar & write down the results  Take mealtime insulin of 18 units HUMALOG PLUS per scale below  Eat meal within 15 minutes    Supper/Dinner:  Take your blood sugar & write down the results  Take mealtime insulin of 18 units HUMALOG PLUS per scale below  Eat meal within 15 minutes    If  your pre-meal blood sugar is: Give this correction factor insulin of HUMALOG    70 to 180 0   181 to 220 2   221 to 260 4   261 to 320 6   321 to 380 8    Greater than 380 10     Bedtime:  Take your blood sugar & write down the results

## 2017-07-20 NOTE — Progress Notes
Pt blood sugarr 55 and asymptomatic. Pt given juice and graham crackers for treatment. Will recheck in 15 mins per protocol.

## 2017-07-20 NOTE — Progress Notes
Diabetes Discharge Instructions      Do fingerstick blood sugar testing before each meal and at bedtime.  Record the values in diary and bring this information to your next clinic appointment.  Always bring your meter to your clinic appointment.    Insulin Treatment Dosing    Basal or Long acting insulin = LEVEMIR - 42 units in the AM   (since you are taking it here in the hospital at noon - move timing back by 2 hours daily until your reach the desired time you want to take this at home).  Take Levemir daily at the same time - every 24 hours   Example:    7/24 Levemir 42 units at 12 pm   7/25 Levemir 42 units at 10 am   7/26 Levemir 42 units at 8 am   7/27 Levemir 42 units at 6 am   7/28 Levemir 42 units at 5 am    If you have high fasting (morning) blood sugars - gradually increase Levemir dosing:  You may increase Levemir antus every 3 days by (2) units if the morning blood sugar average is greater than 150.  If you experience a night time or early morning low blood glucose that dose not have a clear explanation then reduce the Lantus dose the next evening by (2) units.      Prandial or meal time insulin = HUMALOG 18 units three times daily before meals     Correction factor: Add additional Novolog to this scheduled dose based on the premeal blood sugar. This is the correction factor.     181-220 add 2 units   221-260  add 4units   261-320 add 6 units   321-380 add 8 units   380 or higher  add 10 units    For low blood sugar symptoms or blood sugar less than 70 follow hypoglycemia treatment plan given by the diabetes educator and reviewed below as Low blood sugar recognition and treatment.    For blood sugar higher than 400 check urine ketones if positive call physician.    If blood glucose is higher than 250 two days in a row call the physician treating your diabetes for assistance in insulin adjustements.    For hypoglycemia that occurs that is severe, (someone had to help you) call your physician for adjustment in insulin. If it is mild and occurs more than twice in one week call for adjustment in insulin.    Follow up with your primary endocrinologist    The [facility] phone is [phone]. Should you need assistance or wish to be seen here.      Low blood sugar recognition and treatment    A low blood sugar is also called hypoglycemia or a hypo. If you take insulin or certain oral drugs (sulfonylureas such as glimepiride, glipizide and glyburide), you are at risk for hypoglycemia. Drinking alcohol without eating can also increase your chances of having hypoglycemia. Wearing a medical alert bracelet ([email or web address].org) can save your life if you have a severe hypoglycemic reaction.      Low blood sugar usually causes a variety of symptoms. These include:  ??? Shakiness  ??? Weakness  ??? Sweating  ??? Hunger  ??? Confusion  ??? Nightmares or nighttime sweats if the low blood sugar happens while you are asleep    Low blood sugar symptoms typically start when your blood sugar is less than 70 mg/dl. If untreated, extreme low blood sugar can lead to:  ???  Unconsciousness or stupor  ??? Seizures    Rarely, some people with diabetes slip into hypoglycemia without these typical symptoms. This is called hypoglycemic unawareness.  Most episodes of low blood sugar can be prevented by good planning. Skipping meals, taking too much insulin or exercising can cause hypoglycemia.     What to do if you have hypoglycemia.  Treat low blood sugars as soon as possible. Always have a source of carbohydrate with you.  Here are some handy foods to treat hypoglycemia quickly:   ??? Three to five glucose tablets (usually contain about 3 grams of glucose each).  ??? 4 ounces of fruit juice or 8 ounces of milk  ??? Cake frosting in a tube, about a tablespoon.     The Rule of 15.     The Rule of 15 is a simple toll that is easy to remember.  When you think you are having a hypoglycemic reaction: ??? Take your blood sugar (if you can't do this, just treat the symptoms!)  ??? If it is less than 70 mg/dl, eat 15 grams of carbohydrate  ??? Wait 15 minutes  ??? If you blood sugar is still less than 70, take another 15 grams of carbohydrate and re-check your sugar in 15 minutes.     When should you call your doctor or health care provider?   ??? If you have more than 1 mild reaction per week.  ??? If you have any moderate or severe reactions where you cannot treat the reaction yourself, need to go to the emergency room or call the rescue squad  ??? Slip into hypoglycemia without having symptoms (hypoglycemic unawareness).

## 2017-07-20 NOTE — Progress Notes
CARDIOTHORACIC SURGERY DAILY PROGRESS NOTE    PROCEDURE: BYPASS GRAFT CORONARY ARTERYx4 , LIMA, LEVH    POD #: 6    SUBJECTIVE:   Overnight events: hypoglycemic this am, BS 55      ASSESSMENT:  Principal Problem:    Unstable angina (HCC)  Active Problems:    Type 2 diabetes mellitus without complication, without long-term current use of insulin (HCC)    Essential hypertension    Mixed hyperlipidemia    Obstructive sleep apnea syndrome    Obesity    Coronary artery disease involving native coronary artery of native heart    Chronic diastolic CHF (congestive heart failure), NYHA class 2 (HCC)      PLAN:  Neuro ??? Pain controlled on current regimen. Continue PRN oxycodone with scheduled Tylenol. Ultram added prn.   CV ??? SR, rates 70-80s. SBP 110-140s. Continue ASA, statin, metoprolol 25 bid. Resume cozaar at 50 mg. Intra op TEE LVEF 50%.  Resp ??? O2 sats stable on RA. Cont PTA CPAP at night. Cont aggressive pulm toilet.   Renal ??? sCR 0.80-->0.69-->0.8. UOP 3.2 L/24 hrs, net neg 2.4 L.  Continue PTA Lasix 40mg  QD. below pre-op weight.  GI - Cardiac/diabetic diet. Last BM 7/23, continue post op bowel regimen-add Miralax.   ID ??? Afebrile. WBC trending down 21.7-->20.7-->17.2-->15.9-->14.5, Pt on steroids prior to cardiac cath for dye allergy. Last dose 60mg  prednisone 7/16. Cont to monitor WBC.   Heme ??? Hgb 10.7. Heparin DVT prophylaxis, continue mechanical prophylaxis.   FEN ??? type 2 DM,  A1c 10.9% on home regimen. BS 55-239. Symptomatic with low BS. Endocrine following and adjusting insulin.    Activity ??? Patient walking 360 feet  Disposition  - start cozaar. Home when stable per Endo.    OBJECTIVE:  Vitals:    07/19/17 1520 07/19/17 1930 07/19/17 2259 07/20/17 0500   BP: 128/70 147/77 119/72 127/81   Pulse: 82 94 77 88   Temp: 36.6 ???C (97.9 ???F) 36.9 ???C (98.5 ???F) 36.8 ???C (98.3 ???F) 36.3 ???C (97.4 ???F)   SpO2: 98% 94% 95% 93%   Weight:    (!) 136.3 kg (300 lb 6.4 oz)   Height:           Physical Exam:  General: A&O x 3, NAD Cardiovascular: RRR no rub or murmur  Respiratory: LS CTA bil  GI: obese, soft, NT, +BS  Extremities: 1+ bilateral LE edema  Incisions: clean, dry, intact    Prophylaxis Review:  Lines:  No  Antibiotic Usage:  No  VTE:  Pharmacological prophylaxis; SQ Heparin and Mechanical prophylaxis; Sequential compression device  Urinary Catheter: No      Pertinent Meds:               Taking    Reason for Not Taking  1. Aspirin yes    2. B-Blocker yes    3. Statin yes    4. ACE/ARB no EF 50%       LABS:  Lab Results   Component Value Date/Time    WBC 14.5 (H) 07/20/2017 04:21 AM    HGB 10.7 (L) 07/20/2017 04:21 AM    HCT 33.3 (L) 07/20/2017 04:21 AM    PLTCT 327 07/20/2017 04:21 AM          Lab Results   Component Value Date/Time    NA 134 (L) 07/20/2017 04:21 AM    K 4.0 07/20/2017 04:21 AM    CL 98 07/20/2017 04:21 AM    CO2  28 07/20/2017 04:21 AM    BUN 16 07/20/2017 04:21 AM    CR 0.82 07/20/2017 04:21 AM    GLU 119 (H) 07/20/2017 04:21 AM    Lab Results   Component Value Date    MG 3.3 (H) 07/14/2017     No results found for: PO4        Lab Results   Component Value Date    GLUPOC 78 07/20/2017    GLUPOC 55 (L) 07/20/2017    GLUPOC 134 (H) 07/19/2017    GLUPOC 81 07/19/2017    GLUPOC 239 (H) 07/19/2017    GLUPOC 294 (H) 07/19/2017    GLUPOC 198 (H) 07/19/2017    GLUPOC 99 07/19/2017       Shanda Bumps, PA-C  [phone]

## 2017-07-20 NOTE — Progress Notes
Endocrinology Progress Note      Name:  Gregory Acosta   Today's Date:  07/20/2017  Admission Date: 07/10/2017  LOS: 10 days         Principal Problem:    Unstable angina (HCC)  Active Problems:    Type 2 diabetes mellitus without complication, without long-term current use of insulin (HCC)    Essential hypertension    Mixed hyperlipidemia    Obstructive sleep apnea syndrome    Obesity    Coronary artery disease involving native coronary artery of native heart    Chronic diastolic CHF (congestive heart failure), NYHA class 2 (HCC)              Assessment/Plan:        [name]???is a 61 y.o. male with chest pain, HTN, HLD, PAD, RLS, diabetes type 2, spinal stenosis, OSA on CPAP, obesity, who was admitted for chest pain rule out. Endocrinology has been consulted for diabetes management in the setting of high PTA insulin and no insurance.  ???  Type 2???Diabetes Mellitus, uncontrolled  A1c 10.9, uncontrolled  PTA regimen: levemir 80???units BID???and humalog 50???units TID w/ meals, as well as metformin and Glimepiride  Hypoglycemic episodes on this regimen: rare 3-4 times yearly, misses a lot of meal time dosing  Follows up with Dr. [name]???for diabetes management  Diabetic-complications assessment:  ???????????????????????????????????????Retinopathy: yes.   ???????????????????????????????????????Peripheral neuropathy: yes  ???????????????????????????????????????Autonomic neuropathy: no  ???????????????????????????????????????Nephropathy: no.   ???????????????????????????????????????Macrovascular complications: Yes???known CAD  Risk Factor assessment  ???????????????????????????????????????Last lipid profile 07/11/2017  ??????????????????????????????????????????????????????????????????????????????LDL 204???(goal <70)  ??????????????????????????????????????????????????????????????????????????????TG 329???(goal <150)  ???????????????????????????????????????BP at goal (goal <???140/80)  On ACEi/ARB?: Losartan  On Statin?: intolerance  ???  Chest pain  ???  PAD  HLD  HTN: Losartan  RLS  OSA on CPAP  ???  Impression / Recommendations  Decrease Lantus to 40u daily  Continue Aspart 18 unit post meal  [facility]    Patient seen and discussed with Dr. Margy Clarks.    Wyatt Haste, MD   Pager [phone]     Subjective Gregory Acosta is a 61 y.o. male.  Patient feeling well today with good appetite. No complaint or acute events overnight. Had a BG of 55 early this AM but did not feel it.     Denies fever, chills, nausea, vomiting    Physical Exam    General:  Alert, cooperative, no distress, appears stated age  Head:  Normocephalic, atraumatic  Eyes:  EOMI  Lungs:  Non-labored  Abdomen:  Obese abdomen  Skin:   No rashes  Extremities: No cyanosis   Neurologic:  No focal neurologic deficits  Psych: appropriate affect    Medications  Scheduled Meds:    aspirin chewable tablet 81 mg 81 mg Oral QDAY   ezetimibe (ZETIA) tablet 10 mg 10 mg Oral QDAY   fluticasone (FLOVENT HFA) 110 mcg/actuation inhaler 2 puff 2 puff Inhalation BID   furosemide (LASIX) tablet 40 mg 40 mg Oral QDAY   heparin (porcine) PF syringe 5,000 Units 5,000 Units Subcutaneous Q8H   insulin aspart U-100 (NOVOLOG FLEXPEN) injection PEN 0-14 Units 0-14 Units Subcutaneous ACHS   insulin aspart U-100 (NOVOLOG FLEXPEN) injection PEN 18 Units 18 Units Subcutaneous TID w/ meals   insulin glargine (LANTUS SOLOSTAR, BASAGLAR) injection PEN 40 Units 40 Units Subcutaneous QDAY(12)   metoprolol tartrate (LOPRESSOR) tablet 50 mg 50 mg Oral BID   polyethylene glycol 3350 (MIRALAX) packet 17 g 1 packet Oral QDAY   pramipexole (MIRAPEX) tablet 0.25 mg 0.25 mg Oral QHS  rosuvastatin (CRESTOR) tablet 10 mg 10 mg Oral Once per day on Mon Thu   senna/docusate (SENOKOT-S) tablet 2 tablet 2 tablet Oral BID   Continuous Infusions:    PRN and Respiratory Meds:acetaminophen Q6H PRN **OR** acetaminophen Q6H PRN, albuterol Q4H PRN, alum/mag hydroxide/simeth Q4H PRN, bisacodyl QDAY PRN, hydrALAZINE Q6H PRN, lidocaine PF PRN, milk of magnesia (CONC) QDAY PRN, ondansetron Q6H PRN **OR** ondansetron (ZOFRAN) IV Q6H PRN, oxyCODONE Q3H PRN, potassium chloride SR PRN **OR** potassium chloride PRN **OR** potassium chloride in water PRN, pramipexole QDAY PRN, traMADol Q6H PRN      Objective Vital Signs: Last Filed                 Vital Signs: 24 Hour Range   BP: 140/77 (07/24 0730)  Temp: 36.7 ???C (98 ???F) (07/24 0730)  Pulse: 87 (07/24 0730)  Respirations: 18 PER MINUTE (07/24 0730)  SpO2: 99 % (07/24 0730)  O2 Delivery: None (Room Air) (07/24 0730) BP: (119-147)/(70-86)   Temp:  [36.3 ???C (97.4 ???F)-36.9 ???C (98.5 ???F)]   Pulse:  [77-94]   Respirations:  [16 PER MINUTE-18 PER MINUTE]   SpO2:  [93 %-99 %]   O2 Delivery: None (Room Air)   Intensity Pain Scale 0-10 (Pain 1): 4 (07/19/17 2055) Vitals:    07/18/17 0600 07/19/17 0509 07/20/17 0500   Weight: (!) 137.3 kg (302 lb 12.8 oz) (!) 137.2 kg (302 lb 6.4 oz) (!) 136.3 kg (300 lb 6.4 oz)       Intake/Output Summary:  (Last 24 hours)    Intake/Output Summary (Last 24 hours) at 07/20/17 0828  Last data filed at 07/20/17 0349   Gross per 24 hour   Intake              840 ml   Output             3250 ml   Net            -2410 ml      Stool Occurrence: 1    Lab Review  24-hour labs:    Results for orders placed or performed during the hospital encounter of 07/10/17 (from the past 24 hour(s))   POC GLUCOSE    Collection Time: 07/19/17 10:21 AM   Result Value Ref Range    Glucose, POC 294 (H) 70 - 100 MG/DL   POC GLUCOSE    Collection Time: 07/19/17 12:17 PM   Result Value Ref Range    Glucose, POC 239 (H) 70 - 100 MG/DL   POC GLUCOSE    Collection Time: 07/19/17  4:37 PM   Result Value Ref Range    Glucose, POC 81 70 - 100 MG/DL   POC GLUCOSE    Collection Time: 07/19/17  8:51 PM   Result Value Ref Range    Glucose, POC 134 (H) 70 - 100 MG/DL   POC GLUCOSE    Collection Time: 07/20/17  3:41 AM   Result Value Ref Range    Glucose, POC 55 (L) 70 - 100 MG/DL   POC GLUCOSE    Collection Time: 07/20/17  4:07 AM   Result Value Ref Range    Glucose, POC 78 70 - 100 MG/DL   BASIC METABOLIC PANEL    Collection Time: 07/20/17  4:21 AM   Result Value Ref Range    Sodium 134 (L) 137 - 147 MMOL/L    Potassium 4.0 3.5 - 5.1 MMOL/L Chloride 98 98 - 110 MMOL/L  CO2 28 21 - 30 MMOL/L    Anion Gap 8 3 - 12    Glucose 119 (H) 70 - 100 MG/DL    Blood Urea Nitrogen 16 7 - 25 MG/DL    Creatinine 1.61 0.4 - 1.24 MG/DL    Calcium 8.9 8.5 - 09.6 MG/DL    eGFR Non African American >60 >60 mL/min    eGFR African American >60 >60 mL/min   CBC    Collection Time: 07/20/17  4:21 AM   Result Value Ref Range    White Blood Cells 14.5 (H) 4.5 - 11.0 K/UL    RBC 3.58 (L) 4.4 - 5.5 M/UL    Hemoglobin 10.7 (L) 13.5 - 16.5 GM/DL    Hematocrit 04.5 (L) 40 - 50 %    MCV 92.8 80 - 100 FL    MCH 29.8 26 - 34 PG    MCHC 32.1 32.0 - 36.0 G/DL    RDW 40.9 (H) 11 - 15 %    Platelet Count 327 150 - 400 K/UL    MPV 8.7 7 - 11 FL   POC GLUCOSE    Collection Time: 07/20/17  7:30 AM   Result Value Ref Range    Glucose, POC 303 (H) 70 - 100 MG/DL       Point of Care Testing  (Last 24 hours)  Glucose: (!) 119 (07/20/17 0421)  POC Glucose (Download): (!) 303 (07/20/17 0730)    Radiology and other Diagnostics Review:    Pertinent radiology reviewed.

## 2017-07-21 ENCOUNTER — Encounter: Admit: 2017-07-21 | Discharge: 2017-07-21 | Payer: BC Managed Care – PPO

## 2017-07-22 ENCOUNTER — Encounter: Admit: 2017-07-22 | Discharge: 2017-07-22 | Payer: BC Managed Care – PPO

## 2017-07-22 NOTE — Telephone Encounter
Pt doing OK post op. Still having quite a bit of discomfort. Discussed alternating Tylenol with Ultram and taking 2 Ultram at night instead of just 1-with caveat of not taking over 400 mg daily. Informed wife that pt should get gradually better every day but we can refill the Ultram X 1 if needed. Pt walking 7-10" at a time and 3 times a day. Advised to increase amount of walking to work up to 30" steady walking. Incisions C/D/I. Most discomfort is from leg incision. + BM, wife increasing fiber, good appetite. Reviewed upcoming F/U and instructed pt to get PCP appt in next week or two. Verified they have our # to call for any concerns.

## 2017-07-26 ENCOUNTER — Encounter: Admit: 2017-07-26 | Discharge: 2017-07-26 | Payer: BC Managed Care – PPO

## 2017-07-26 NOTE — Telephone Encounter
Received phone call from pt's wife, Gregory Acosta, with concerns about pt's blood sugar management since discharge on 07/20/17. Pt was seen by this diabetes educator and Endocrinology team while in-patient. Gregory Acosta reports pt's blood sugars have been 150s-310s at home. Gregory Acosta has been utilizing the treat-to-target for the Levemir dosing. Pt taking 46 units at 1000 every day. Pt was discharged on Humalog 18 units with MDCF starting at 180 mg/dl TID. Gregory Acosta reports pt eats a "lower carb" diet at home, only about 30-40g CHO/meal, so she has only been giving patient 9 units with sliding scale with meals. Pt's blood sugars consistently 160s-220s before breakfast, 300-320s at lunch, and 150s-170s at suppertime. Discussed concerns with Dr. Margy ClarksGrdinovac with Endocrinology who saw patient prior to d/c. Per MD, okay for patient to take 12 units with breakfast. Relayed plan of care with Gregory Acosta who verbalized understanding. Provided Gregory Acosta with [facility] if any issues/concerns arise [phone]).

## 2017-07-27 ENCOUNTER — Encounter: Admit: 2017-07-27 | Discharge: 2017-07-27 | Payer: BC Managed Care – PPO

## 2017-07-29 ENCOUNTER — Encounter: Admit: 2017-07-29 | Discharge: 2017-07-29 | Payer: BC Managed Care – PPO

## 2017-07-29 NOTE — Progress Notes
FMLA forms filled out, signed, faxed to [employer] @ [phone], originals mailed to pt's home address

## 2017-07-30 ENCOUNTER — Encounter: Admit: 2017-07-30 | Discharge: 2017-07-30 | Payer: BC Managed Care – PPO

## 2017-07-30 DIAGNOSIS — Z951 Presence of aortocoronary bypass graft: Principal | ICD-10-CM

## 2017-07-30 MED ORDER — CEPHALEXIN 500 MG PO CAP
500 mg | ORAL_CAPSULE | Freq: Four times a day (QID) | ORAL | 0 refills | Status: AC
Start: 2017-07-30 — End: ?

## 2017-07-30 NOTE — Progress Notes
Cardiac Rehab Call Back Note:pt having reddness in his leg incision, have notified Dr Caren Macadamoan.    Are you tolerating activity?Yes  Is pain controlled?Yes  Is appetite normal?Yes  Are you having symptoms of heart discomfort?No  Are you having signs of infection at your incision sites or groin site?Yes  Do you want outpt cardiac rehab?Yes

## 2017-07-30 NOTE — Telephone Encounter
Additional picture os leg incision sent in. Pt denies any fever or chills, reports BS this AM is 303. Reviewed by Cherlynn PoloSBild, NP. Pt will be started on Keflex, 500 mg QID X 7 days. Advised wife, Raynelle FanningJulie, to keep area clean and dry, call us should pt develop any fevers, chills or any drainage from area. PT has F/U with PCP week after next but can easily get in to PCP if needed.Wife verbalized understanding and Keflex script sent in to [facility] [place].

## 2017-07-30 NOTE — Progress Notes
Cardiac Rehab Call Back Note:unable to contact pt, sent letter.    Are you tolerating activity?N/A  Is pain controlled?N/A  Is appetite normal?N/A  Are you having symptoms of heart discomfort?N/A  Are you having signs of infection at your incision sites or groin site?N/A  Do you want outpt cardiac rehab?N/A

## 2017-08-04 ENCOUNTER — Encounter: Admit: 2017-08-04 | Discharge: 2017-08-04 | Payer: BC Managed Care – PPO

## 2017-08-04 NOTE — Telephone Encounter
Received call from patient's spouse with questions about patient's recovery. She states they have been taking the Keflex as Rx and his leg incisions are improving. She reports he noted numbness in his hands/arms after surgery. He states one arm has fully recovered, the other side is improving but not yet fully resolved. Discussed that they should continue to monitor this and call if it would worsen/not improve. She also states that his "biggest pain issue now, isn't his sternum, its a knot of muscles in his shoulder". She is questioning if he should try taking a muscle relaxant. Discussed that we like to avoid use of muscle relaxants, and instructed her to try things like ice/heat, massage, or use of lidocaine patches to see if this improves. She reports they will f/u with his PCP if these methods are not effective.

## 2017-08-10 ENCOUNTER — Encounter: Admit: 2017-08-10 | Discharge: 2017-08-11 | Payer: BC Managed Care – PPO

## 2017-08-10 DIAGNOSIS — R69 Illness, unspecified: Principal | ICD-10-CM

## 2017-08-23 ENCOUNTER — Ambulatory Visit: Admit: 2017-08-23 | Discharge: 2017-08-23 | Payer: BC Managed Care – PPO

## 2017-08-23 DIAGNOSIS — I2510 Atherosclerotic heart disease of native coronary artery without angina pectoris: Principal | ICD-10-CM

## 2017-08-23 DIAGNOSIS — Z951 Presence of aortocoronary bypass graft: ICD-10-CM

## 2017-08-23 DIAGNOSIS — R200 Anesthesia of skin: ICD-10-CM

## 2017-08-23 NOTE — Progress Notes
Date of Service: 08/23/2017       Subjective:             Gregory Acosta is a 61 y.o. male.      History of Present Illness  Today we had the pleasure of seeing your patient, Gregory Acosta, in our office for routine postop follow up after bypass graft coronary artery x 4, left internal mammary artery and left endoscopic vein harvest performed by Dr. Lynne Logan on 07/14/17. He had a fairly uneventful hospital course and was discharged home. He was seen by endocrine for his uncontrolled DM and discharged home.     Since discharge Gregory Acosta states he has been doing well. He has been walking daily.  His main complaint has been left arm numbness and weakness which involves his left hand third through fifth fingers.  He states that this has not improved and he has seen his PCP and x-ray was performed which revealed compression in his neck.  He was placed on a muscle relaxant as well as lidocaine patches which does help.  He is following up with them in the next few weeks.  He did have a CAT scan while inpatient which revealed multiple bilateral pulmonary nodules and while discussing this with the patient this is a known finding that he has been followed for at [facility] in the past.  He is participating in cardiac rehab at [facility] and [place].  He denies any fevers, drainage from his sternum or popping/clicking of his chest.  He did develop erythema of his left endo-vein harvest site and was initially placed on Keflex by our office.  He was seen by his PCP afterwards and they extended this for a few more days and ultimately placed him on clindamycin and this area is improving.  He has had some serosanguineous drainage but denies any fevers or purulent drainage.    He is scheduled to see Dr. Paris Lore in November.    Chest xray performed today revealed stable sternum without significant effusion and a chronic right elevated hemidiaphragm.    Patient was informed that at 6 weeks from the date of surgery he may gradually increase the amount of weight that he is lifting. He should start at low weights and gradually work his way up. If there is no discomfort while doing it, then that is ok. If he has discomfort then he should stop. At 3 months from the date of surgery the sternum should be completely healed like it was never broken. If he has any questions or concerns he will notify our office otherwise we feel no further surgical follow-up is warranted. He will continue to follow with his PCP and cardiologist for continued care. Thank you for the opportunity to participate in the care of Gregory Acosta.       Review of Systems   Constitution: Negative.   HENT: Negative.    Eyes: Negative.    Cardiovascular: Negative.    Respiratory: Negative.    Endocrine: Negative.    Hematologic/Lymphatic: Negative.    Skin: Positive for poor wound healing.   Musculoskeletal: Positive for arthritis, back pain, joint pain, neck pain and stiffness (Post surgery sever shoulder left side. Can't grip.).   Gastrointestinal: Negative.    Genitourinary: Negative.    Neurological: Positive for focal weakness, numbness, paresthesias and sensory change.   Psychiatric/Behavioral: The patient has insomnia.    Allergic/Immunologic: Negative.          Objective:         ???  acetaminophen (TYLENOL 8 HOUR PO) Take  by mouth.   ??? ALPRAZolam (XANAX) 0.25 mg tablet Take 0.25 mg by mouth at bedtime as needed for Anxiety.   ??? aspirin EC 81 mg tablet Take 1 tablet by mouth daily. Take with food.   ??? clindamycin(+) (CLEOCIN) 300 mg capsule Take 300 mg by mouth three times daily. Take with 8oz of water.   ??? ezetimibe (ZETIA) 10 mg tablet Take 1 tablet by mouth daily.   ??? fluticasone (FLOVENT HFA) 110 mcg/actuation inhaler Inhale 2 puffs by mouth into the lungs twice daily.   ??? furosemide (LASIX) 40 mg tablet Take 40 mg by mouth every morning.   ??? insulin detemir(+) (LEVEMIR) 100 unit/mL soln Inject 70 Units under the skin once. ??? insulin lispro(+) (HUMALOG KWIKPEN) 100 unit/mL injection PEN Inject  under the skin three times daily with meals. Sliding Scale.   ??? losartan (COZAAR) 50 mg tablet Take 50 mg by mouth daily.   ??? metFORMIN (GLUCOPHAGE) 1,000 mg tablet Take 1/2 tab with dinner for a week. Then increase to 1/2 tab with breakfast and dinner for a week. Then increase to 1 tab with breakfast and 1 tab with dinner thereafter.   ??? metoprolol tartrate (LOPRESSOR) 25 mg tablet Take 1 tablet by mouth twice daily.   ??? pramipexole (MIRAPEX) 0.25 mg tablet Take 1 tablet by mouth every evening. Take an additional 1 to 2 tablets by mouth at bedtime as needed for restless leg syndrome.   ??? rosuvastatin (CRESTOR) 10 mg tablet Take 1 tablet by mouth twice weekly.   ??? senna/docusate (SENOKOT-S) 8.6/50 mg tablet Take 2 tablets by mouth twice daily.   ??? tiZANidine (ZANAFLEX) 4 mg tablet Take 4 mg by mouth every 6 hours as needed.   ??? traMADol (ULTRAM) 50 mg tablet Take 1 tablet by mouth every 6 hours as needed for Pain.     Vitals:    08/23/17 1335   BP: 140/68   Pulse: 95   SpO2: 96%   Weight: 125.3 kg (276 lb 4.8 oz)   Height: 1.829 m (6')     Body mass index is 37.47 kg/m???.     Physical Exam   Constitutional: He is oriented to person, place, and time. He appears well-developed and well-nourished.   obese   HENT:   Head: Normocephalic and atraumatic.   Eyes: Conjunctivae and EOM are normal. Pupils are equal, round, and reactive to light.   Neck: Normal range of motion. Neck supple.   Cardiovascular: Normal rate and regular rhythm.    No murmur heard.  Pulmonary/Chest: Effort normal and breath sounds normal.   Abdominal: Soft. Bowel sounds are normal.   obese, round, soft, nontender   Musculoskeletal:   left arm numbness/tingling and discomfort involving the 3rd-5th fingers   Neurological: He is alert and oriented to person, place, and time.   Skin: Skin is warm.   midsternal incision is C/D/I without erythema or drainage. Sternum is stable with cough. Left EVH without erythema with scant amount of serosang drainage noted with granulation tissue present   Psychiatric: He has a normal mood and affect. His behavior is normal. Judgment and thought content normal.     Hedwig Morton, FNP-C 08/23/17 @ 1430.       Assessment and Plan:  I performed a history and physical examination of the patient and discussed his management with the midlevel provider. I reviewed the midlevel provider note and agree with the documented findings and plan of care.  From the cardiac standpoint, this patient has done very well.  He does have a left lower leg incision from the saphenous harvest site that is healing rather slowly, likely because of his previously uncontrolled diabetes.  His blood glucose levels are improving and he has lost significant amounts of weight since surgery.  This will help him in the long run as well.    However, he has significant left arm numbness especially in the ulnar nerve distribution.  I have asked him to make an appointment with a physical therapist for cervical spine evaluation.  If necessary, a neurosurgery consultation could also be obtained.    He and his wife asked appropriate questions, all of which were answered to their satisfaction.  I believe his ulnar nerve distribution deficit is likely related to the combination of pre-existing cervical spine changes and the fact that he had a rather long operation with general anesthesia.  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    I truly appreciate the opportunity of taking care of your delightful patient.  Thank you very much for the kind referral.  We will keep you updated.    Sincerely,    Lynne Logan, MD  Cardiovascular and Thoracic Surgery  [facility]  [place], [place]    [phone]  [email or web address]

## 2017-09-06 ENCOUNTER — Encounter: Admit: 2017-09-06 | Discharge: 2017-09-06 | Payer: BC Managed Care – PPO

## 2017-09-06 ENCOUNTER — Ambulatory Visit: Admit: 2017-09-06 | Discharge: 2017-09-06 | Payer: BC Managed Care – PPO

## 2017-09-06 ENCOUNTER — Ambulatory Visit: Admit: 2017-09-06 | Discharge: 2017-09-07 | Payer: BC Managed Care – PPO

## 2017-09-06 DIAGNOSIS — M4800 Spinal stenosis, site unspecified: ICD-10-CM

## 2017-09-06 DIAGNOSIS — I517 Cardiomegaly: ICD-10-CM

## 2017-09-06 DIAGNOSIS — G4733 Obstructive sleep apnea (adult) (pediatric): ICD-10-CM

## 2017-09-06 DIAGNOSIS — E782 Mixed hyperlipidemia: ICD-10-CM

## 2017-09-06 DIAGNOSIS — M542 Cervicalgia: Principal | ICD-10-CM

## 2017-09-06 DIAGNOSIS — Z8249 Family history of ischemic heart disease and other diseases of the circulatory system: ICD-10-CM

## 2017-09-06 DIAGNOSIS — R079 Chest pain, unspecified: ICD-10-CM

## 2017-09-06 DIAGNOSIS — M5412 Radiculopathy, cervical region: ICD-10-CM

## 2017-09-06 DIAGNOSIS — I10 Essential (primary) hypertension: ICD-10-CM

## 2017-09-06 DIAGNOSIS — I739 Peripheral vascular disease, unspecified: ICD-10-CM

## 2017-09-06 DIAGNOSIS — G2581 Restless legs syndrome: ICD-10-CM

## 2017-09-06 DIAGNOSIS — E119 Type 2 diabetes mellitus without complications: Principal | ICD-10-CM

## 2017-09-06 NOTE — Progress Notes
Subjective:       Gregory Acosta presents today with bilateral upper extremity numbness.     History of Present Illness     Gregory Acosta is a 61 y.o. male who underwent a Quadrouple CABG on 07/14/2017 and awoke with bilateral upper extremity numbness/pain.  He reports severe, continuous nerve pain in both extremities originally; but now mainly in the LUE with radiation into his 3rd, 4th and 5th digits. He has LT scapular pain, which is minimal compared to his LUE pain.    The pain is continuous, worsens at night.  He denies any triggers which worsen his pain.  He reports decrease in fine motor function of his LT hand.  He finds it difficult to use a wrench or do his accu checks.  He denies any neck pain. He replies all the numbness/tingling/pain has resolved in his RUE.  He denies any gait difficulty and walks independently.  He is undergoing PT and does report some improvement in pain/motor function in LUE.  He uses a muscle relaxant at night with minimal relief.  He does have a history of a Lumbar Surgery with Dr. Carleene Mains in 2015, but denies any back or lower extremity pain.    His medical history includes:  Type 2 DM, HTN, CAD.         Review of Systems   Constitutional: Positive for fatigue.   HENT: Negative.    Eyes: Negative.    Respiratory: Negative.    Cardiovascular: Negative.    Gastrointestinal: Negative.    Endocrine: Negative.    Genitourinary: Negative.    Musculoskeletal: Negative.    Skin: Negative.    Allergic/Immunologic: Negative.    Neurological: Positive for weakness (LUE) and numbness (Numbness/pain LT scapular extending down LUE to 3rd, 4th, and 5th digits. ).   Hematological: Negative.    Psychiatric/Behavioral: Negative.          Objective:         ??? acetaminophen (TYLENOL 8 HOUR PO) Take  by mouth.   ??? ALPRAZolam (XANAX) 0.25 mg tablet Take 0.25 mg by mouth at bedtime as needed for Anxiety.   ??? aspirin EC 81 mg tablet Take 1 tablet by mouth daily. Take with food. ??? clindamycin(+) (CLEOCIN) 300 mg capsule Take 300 mg by mouth three times daily. Take with 8oz of water.   ??? ezetimibe (ZETIA) 10 mg tablet Take 1 tablet by mouth daily.   ??? fluticasone (FLOVENT HFA) 110 mcg/actuation inhaler Inhale 2 puffs by mouth into the lungs twice daily.   ??? furosemide (LASIX) 40 mg tablet Take 40 mg by mouth every morning.   ??? insulin detemir(+) (LEVEMIR) 100 unit/mL soln Inject 70 Units under the skin once.   ??? insulin lispro(+) (HUMALOG KWIKPEN) 100 unit/mL injection PEN Inject  under the skin three times daily with meals. Sliding Scale.   ??? losartan (COZAAR) 50 mg tablet Take 50 mg by mouth daily.   ??? metFORMIN (GLUCOPHAGE) 1,000 mg tablet Take 1/2 tab with dinner for a week. Then increase to 1/2 tab with breakfast and dinner for a week. Then increase to 1 tab with breakfast and 1 tab with dinner thereafter.   ??? metoprolol tartrate (LOPRESSOR) 25 mg tablet Take 1 tablet by mouth twice daily.   ??? pramipexole (MIRAPEX) 0.25 mg tablet Take 1 tablet by mouth every evening. Take an additional 1 to 2 tablets by mouth at bedtime as needed for restless leg syndrome.   ??? rosuvastatin (CRESTOR) 10 mg tablet  Take 1 tablet by mouth twice weekly.   ??? senna/docusate (SENOKOT-S) 8.6/50 mg tablet Take 2 tablets by mouth twice daily.   ??? tiZANidine (ZANAFLEX) 4 mg tablet Take 4 mg by mouth every 6 hours as needed.   ??? traMADol (ULTRAM) 50 mg tablet Take 1 tablet by mouth every 6 hours as needed for Pain.     Vitals:    09/06/17 1516   BP: 132/79   Pulse: 99     There is no height or weight on file to calculate BMI.     Physical Exam       Alert/Fully Oriented.  Speech Clear/Appropriate.  LCTA.  Resp Easy Unlabored.  Apical heart rate 90 and regular.  No gallups/murmurs.  Abdomen Soft BS++.  Non Tender.  Motor/Strength LLE Bilateral 5/5.  LUE 4+5/RUE 5/5.  DTR Normal.  Hoffman Neg. Sensation Decreased LFA and 3rd, 4th, and 5th digit.  Gait Steady. Lumbar Flex/Ext Plain Films show:  Moderate cervical spondylosis and disc degeneration from C4 C6 through C7-T1.    Assessment and Plan:     [name] presents today, after onset of bilateral upper extremity pain/numbness and decrease in LT fine motor dexterity, immediately following a Quadruple CABG.  He reports the symptoms in his RUE have resolved and is noting slight improvement on the LUE with PT.  He denies any cervical pain and is not myelopathic.  We have discussed his symptoms may have occurred due to positioning of his neck during his CABG.  We had an extensive discussion regarding his symptoms are most likely related to his cervical spine.  We have also discussed what symptoms to watch for and would require immediate attention is an ER setting such as:  Gait/balance changes, decrease in upper extremity function, and weakness of his lower extremities.  He will move forward and schedule him for a cervical MRI with/without contrast and a f/u with Dr. Burman Riis. He should continue PT as he is seeing some improvement.  He and his wife voiced understanding and agree with the above plan.  All of their questions/concerns were addressed.

## 2017-09-09 ENCOUNTER — Encounter: Admit: 2017-09-09 | Discharge: 2017-09-09 | Payer: BC Managed Care – PPO

## 2017-09-09 DIAGNOSIS — R69 Illness, unspecified: Principal | ICD-10-CM

## 2017-10-28 ENCOUNTER — Encounter: Admit: 2017-10-28 | Discharge: 2017-10-28 | Payer: BC Managed Care – PPO

## 2017-10-28 NOTE — Telephone Encounter
Received voicemail on nurse line today from patient's spouse, [name], regarding medication Rosuvastatin.  Spouse states patient started taking medication in July, but stopped taking it 4-6 weeks ago due to muscle aches and pains.  [name] is asking for alternative for cholesterol lowering medications.    Patient has appointment with Dr. Paris Lore on 11/04/17 at 10:00 am. Called and spoke with spouse, [name], regarding medication.  Patient will discuss with Dr. Paris Lore at office visit for alternatives to medication.  Will remove from med list.

## 2017-11-23 ENCOUNTER — Encounter: Admit: 2017-11-23 | Discharge: 2017-11-23 | Payer: BC Managed Care – PPO

## 2017-11-23 NOTE — Telephone Encounter
Spoke with spouse, Gregory Acosta. Patient is s/p CABG x 30 June 2017. Rosuvastatin 10 mg twice weekly was initiated on hospital discharge but patient has not been tolerating it due to muscle aches. He stopped taking it approximately 8 weeks ago. The muscle aches have resolved. Patient has a history of statin intolerance and has been unwilling to take statins due to the myalgias. Spouse is asking for an alternative medication to try. Patient continues taking Zetia 10 mg daily. Patient's LDL on 07/11/17 (10:32 AM) was 204. Patient was scheduled to see Gregory Acosta in follow-up on 11/8 but cancelled. Patient has been rescheduled for 02/08/2018. Advised patient and spouse we would call back with Gregory Acosta's recommendations.

## 2017-11-30 ENCOUNTER — Encounter: Admit: 2017-11-30 | Discharge: 2017-11-30 | Payer: BC Managed Care – PPO

## 2017-11-30 MED ORDER — EVOLOCUMAB 140 MG/ML SC PNIJ
140 mg | SUBCUTANEOUS | 5 refills | 28.00000 days | Status: AC
Start: 2017-11-30 — End: 2018-02-08

## 2017-12-02 ENCOUNTER — Encounter: Admit: 2017-12-02 | Discharge: 2017-12-02 | Payer: BC Managed Care – PPO

## 2017-12-13 ENCOUNTER — Encounter: Admit: 2017-12-13 | Discharge: 2017-12-13 | Payer: BC Managed Care – PPO

## 2017-12-13 NOTE — Progress Notes
Note sent to [facility] to check on Repatha approval/coverage.

## 2017-12-13 NOTE — Progress Notes
The cost for Repatha is unaffordable. The copay is $628 a month cash price.  Gregory Acosta has stated this copay is not affordable.  Patient does not qualify for assistance based on income. Notified nurse.    Juliane Lackachael Zell Hylton  Pharmacy Patient Advocate

## 2017-12-14 NOTE — Progress Notes
Response received from specialty pharmacy that patient does not have prescription insurance and he does not qualify for PCSK9 inhibitor manufacturer assistance.    LM for patient asking if he has checked with the pharmacy who fills his current meds under the 340B plan to see if they have any additional advice or feedback about PCSK9 inhibitor coverage. Provided the direct nursing phone number for patient to call. As patient is relatively new to our practice, it is unclear all of the statins patient has tried and failed in the past. Asked patient to let us know.     Routing note to Dr. Paris LoreWiley in case of other recommendations.

## 2018-01-21 ENCOUNTER — Encounter: Admit: 2018-01-21 | Discharge: 2018-01-21 | Payer: BC Managed Care – PPO

## 2018-02-08 ENCOUNTER — Ambulatory Visit: Admit: 2018-02-08 | Discharge: 2018-02-09 | Payer: BC Managed Care – PPO

## 2018-02-08 ENCOUNTER — Encounter: Admit: 2018-02-08 | Discharge: 2018-02-08 | Payer: BC Managed Care – PPO

## 2018-02-08 DIAGNOSIS — I739 Peripheral vascular disease, unspecified: ICD-10-CM

## 2018-02-08 DIAGNOSIS — G4733 Obstructive sleep apnea (adult) (pediatric): ICD-10-CM

## 2018-02-08 DIAGNOSIS — R079 Chest pain, unspecified: ICD-10-CM

## 2018-02-08 DIAGNOSIS — G2581 Restless legs syndrome: ICD-10-CM

## 2018-02-08 DIAGNOSIS — I517 Cardiomegaly: ICD-10-CM

## 2018-02-08 DIAGNOSIS — I10 Essential (primary) hypertension: ICD-10-CM

## 2018-02-08 DIAGNOSIS — E782 Mixed hyperlipidemia: ICD-10-CM

## 2018-02-08 DIAGNOSIS — E119 Type 2 diabetes mellitus without complications: Principal | ICD-10-CM

## 2018-02-08 DIAGNOSIS — Z8249 Family history of ischemic heart disease and other diseases of the circulatory system: ICD-10-CM

## 2018-02-08 DIAGNOSIS — M4800 Spinal stenosis, site unspecified: ICD-10-CM

## 2018-02-08 MED ORDER — EVOLOCUMAB 140 MG/ML SC PNIJ
140 mg | SUBCUTANEOUS | 11 refills | 28.00000 days | Status: AC
Start: 2018-02-08 — End: 2018-02-09

## 2018-02-09 ENCOUNTER — Encounter: Admit: 2018-02-09 | Discharge: 2018-02-09 | Payer: BC Managed Care – PPO

## 2018-02-09 DIAGNOSIS — Z6838 Body mass index (BMI) 38.0-38.9, adult: ICD-10-CM

## 2018-02-09 DIAGNOSIS — I10 Essential (primary) hypertension: ICD-10-CM

## 2018-02-09 DIAGNOSIS — E7849 Other hyperlipidemia: ICD-10-CM

## 2018-02-09 DIAGNOSIS — I2510 Atherosclerotic heart disease of native coronary artery without angina pectoris: Principal | ICD-10-CM

## 2018-02-09 DIAGNOSIS — E119 Type 2 diabetes mellitus without complications: Secondary | ICD-10-CM

## 2018-02-22 ENCOUNTER — Encounter: Admit: 2018-02-22 | Discharge: 2018-02-22 | Payer: BC Managed Care – PPO

## 2018-02-22 DIAGNOSIS — E782 Mixed hyperlipidemia: Principal | ICD-10-CM

## 2018-02-22 DIAGNOSIS — I2510 Atherosclerotic heart disease of native coronary artery without angina pectoris: ICD-10-CM

## 2018-02-24 ENCOUNTER — Encounter: Admit: 2018-02-24 | Discharge: 2018-02-24 | Payer: BC Managed Care – PPO

## 2018-03-01 ENCOUNTER — Encounter: Admit: 2018-03-01 | Discharge: 2018-03-01 | Payer: BC Managed Care – PPO

## 2018-03-01 DIAGNOSIS — E559 Vitamin D deficiency, unspecified: ICD-10-CM

## 2018-03-01 DIAGNOSIS — E7800 Pure hypercholesterolemia, unspecified: Principal | ICD-10-CM

## 2018-03-01 DIAGNOSIS — E039 Hypothyroidism, unspecified: ICD-10-CM

## 2018-03-01 DIAGNOSIS — E119 Type 2 diabetes mellitus without complications: ICD-10-CM

## 2018-03-31 ENCOUNTER — Encounter: Admit: 2018-03-31 | Discharge: 2018-03-31 | Payer: BC Managed Care – PPO

## 2018-03-31 DIAGNOSIS — G4733 Obstructive sleep apnea (adult) (pediatric): ICD-10-CM

## 2018-03-31 DIAGNOSIS — R079 Chest pain, unspecified: ICD-10-CM

## 2018-03-31 DIAGNOSIS — Z8249 Family history of ischemic heart disease and other diseases of the circulatory system: ICD-10-CM

## 2018-03-31 DIAGNOSIS — I739 Peripheral vascular disease, unspecified: ICD-10-CM

## 2018-03-31 DIAGNOSIS — I517 Cardiomegaly: ICD-10-CM

## 2018-03-31 DIAGNOSIS — I10 Essential (primary) hypertension: ICD-10-CM

## 2018-03-31 DIAGNOSIS — M4800 Spinal stenosis, site unspecified: ICD-10-CM

## 2018-03-31 DIAGNOSIS — E119 Type 2 diabetes mellitus without complications: Principal | ICD-10-CM

## 2018-03-31 DIAGNOSIS — G2581 Restless legs syndrome: ICD-10-CM

## 2018-03-31 DIAGNOSIS — E782 Mixed hyperlipidemia: ICD-10-CM

## 2018-07-07 LAB — COMPREHENSIVE METABOLIC PANEL
Lab: 0.8
Lab: 1.1
Lab: 135
Lab: 155
Lab: 25
Lab: 25
Lab: 3.5
Lab: 32
Lab: 37
Lab: 4.2
Lab: 8.4
Lab: 9.8
Lab: 96
Lab: 99

## 2018-07-07 LAB — LIPID PROFILE
Lab: 123 — ABNORMAL LOW
Lab: 156 — ABNORMAL HIGH
Lab: 180
Lab: 263 — ABNORMAL HIGH
Lab: 31 — ABNORMAL LOW
Lab: 8 — ABNORMAL HIGH

## 2018-07-07 LAB — THYROID STIMULATING HORMONE-TSH: Lab: 3.3

## 2018-07-07 LAB — HEMOGLOBIN A1C: Lab: 9.4

## 2018-07-15 ENCOUNTER — Encounter: Admit: 2018-07-15 | Discharge: 2018-07-15 | Payer: BC Managed Care – PPO

## 2018-07-15 DIAGNOSIS — R209 Unspecified disturbances of skin sensation: ICD-10-CM

## 2018-07-15 DIAGNOSIS — E119 Type 2 diabetes mellitus without complications: Principal | ICD-10-CM

## 2018-07-20 ENCOUNTER — Ambulatory Visit: Admit: 2018-07-20 | Discharge: 2018-07-20 | Payer: BC Managed Care – PPO

## 2018-07-20 DIAGNOSIS — E119 Type 2 diabetes mellitus without complications: Principal | ICD-10-CM

## 2018-07-20 DIAGNOSIS — R209 Unspecified disturbances of skin sensation: Secondary | ICD-10-CM

## 2018-07-29 ENCOUNTER — Encounter: Admit: 2018-07-29 | Discharge: 2018-07-29 | Payer: BC Managed Care – PPO

## 2018-10-18 ENCOUNTER — Encounter: Admit: 2018-10-18 | Discharge: 2018-10-18 | Payer: BC Managed Care – PPO

## 2018-10-18 DIAGNOSIS — E119 Type 2 diabetes mellitus without complications: ICD-10-CM

## 2018-10-18 DIAGNOSIS — E039 Hypothyroidism, unspecified: ICD-10-CM

## 2018-10-18 DIAGNOSIS — E7800 Pure hypercholesterolemia, unspecified: Principal | ICD-10-CM

## 2018-10-20 ENCOUNTER — Encounter: Admit: 2018-10-20 | Discharge: 2018-10-20 | Payer: BC Managed Care – PPO

## 2018-10-20 MED ORDER — EVOLOCUMAB 140 MG/ML SC PNIJ
140 mg | SUBCUTANEOUS | 1 refills | 28.00000 days | Status: AC
Start: 2018-10-20 — End: ?

## 2018-12-06 ENCOUNTER — Encounter: Admit: 2018-12-06 | Discharge: 2018-12-06 | Payer: BC Managed Care – PPO

## 2018-12-08 ENCOUNTER — Encounter: Admit: 2018-12-08 | Discharge: 2018-12-08 | Payer: BC Managed Care – PPO

## 2018-12-08 DIAGNOSIS — Z1329 Encounter for screening for other suspected endocrine disorder: ICD-10-CM

## 2018-12-08 DIAGNOSIS — E559 Vitamin D deficiency, unspecified: ICD-10-CM

## 2018-12-08 DIAGNOSIS — E7800 Pure hypercholesterolemia, unspecified: Principal | ICD-10-CM

## 2018-12-09 ENCOUNTER — Encounter: Admit: 2018-12-09 | Discharge: 2018-12-09 | Payer: BC Managed Care – PPO

## 2019-01-23 LAB — LIVER FUNCTION PANEL
Lab: 0.5
Lab: 28
Lab: 3.9
Lab: 30
Lab: 7.9
Lab: 86

## 2019-01-23 LAB — BASIC METABOLIC PANEL
Lab: 1.2
Lab: 134
Lab: 18
Lab: 225
Lab: 24
Lab: 4.5
Lab: 9.8
Lab: >60
Lab: >60

## 2019-01-23 LAB — CBC
Lab: 11
Lab: 5.3
Lab: 50

## 2019-01-23 LAB — HEMOGLOBIN A1C: Lab: 9.6 — ABNORMAL HIGH (ref 4.5–6.5)

## 2019-01-23 LAB — LIPID PROFILE: Lab: 273

## 2019-01-23 LAB — THYROID STIMULATING HORMONE-TSH: Lab: 4.1

## 2019-02-14 IMAGING — CR NECK
1 series · 1 of 1 positions shown · non-contrast
Comparison: none

[c-spine lat]
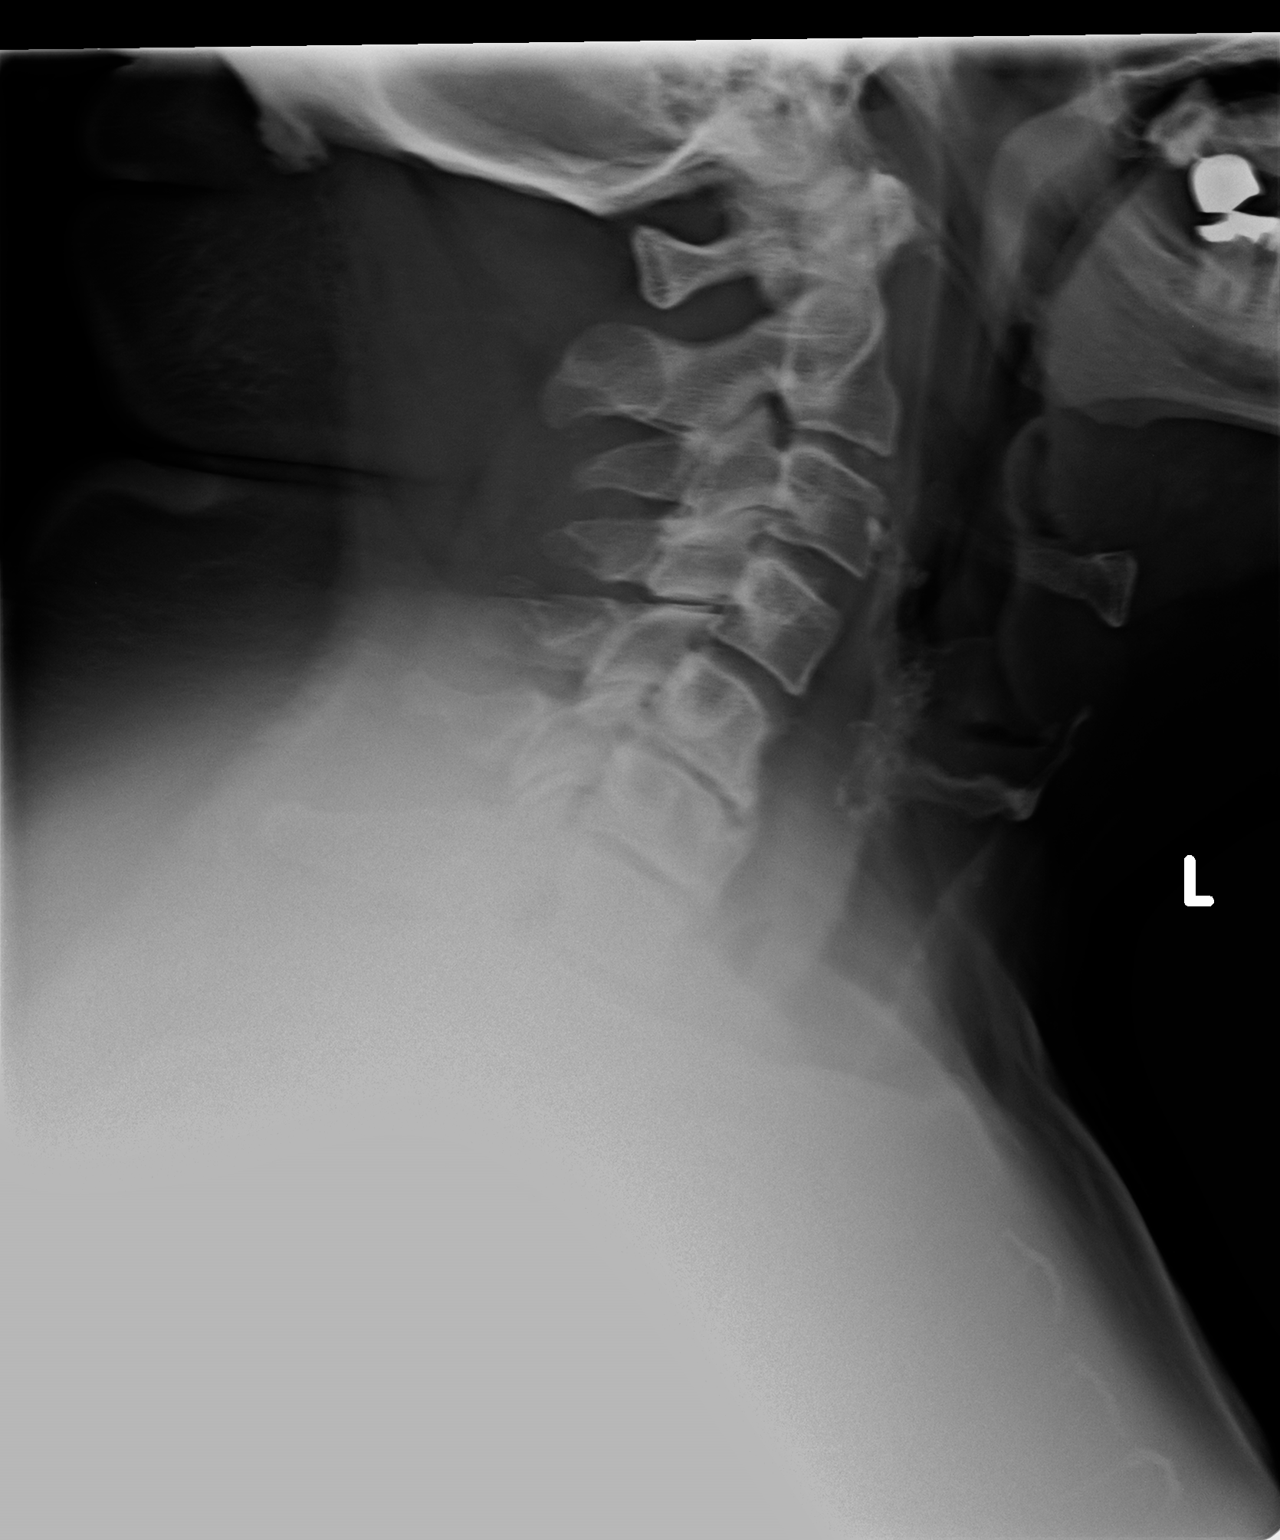

[1 of 1 positions shown; findings below may reference images not displayed]

EXAM

RADIOLOGICAL EXAMINATION, SPINE, CERVICAL 4 OR MORE VIEWS, CPT 53010

INDICATION

neck npain with radiation to lt side, numbness in fingers
NECK PAIN INTO LEFT HAND. NUMBNESS. TJ

TECHNIQUE

Six views of the cervical spine were acquired.

COMPARISONS

There are no previous examinations available for comparison at the time of dictation.

FINDINGS

There are 7 cervical vertebral bodies identified. The vertebral body heights are adequately
preserved. There is intervertebral disc space narrowing at the C5-6 and C6-7 levels. There are no
fractures or subluxations. Reversal of the usual cervical lordosis is identified.

Open mouth view demonstrates a normal relationship of C1-C2. Oblique views demonstrate bilateral
moderately severe neural foraminal narrowing at the C3-4 level. There is also evidence of moderate
left-sided neural foraminal narrowing at the C5-6 level.

IMPRESSION

Cervical spondylosis as described above with intervertebral disc space narrowing at the C5-6 and C6
-7 levels. There is bilateral moderately severe neural foraminal narrowing at the C3-4 level and
moderate left-sided neural foraminal narrowing at C5-6.

## 2019-03-27 ENCOUNTER — Ambulatory Visit: Admit: 2019-03-27 | Discharge: 2019-03-28 | Payer: BC Managed Care – PPO

## 2019-03-27 ENCOUNTER — Encounter: Admit: 2019-03-27 | Discharge: 2019-03-27 | Payer: BC Managed Care – PPO

## 2019-03-27 DIAGNOSIS — I10 Essential (primary) hypertension: ICD-10-CM

## 2019-03-27 DIAGNOSIS — G2581 Restless legs syndrome: ICD-10-CM

## 2019-03-27 DIAGNOSIS — I517 Cardiomegaly: ICD-10-CM

## 2019-03-27 DIAGNOSIS — I2510 Atherosclerotic heart disease of native coronary artery without angina pectoris: Principal | ICD-10-CM

## 2019-03-27 DIAGNOSIS — E119 Type 2 diabetes mellitus without complications: Principal | ICD-10-CM

## 2019-03-27 DIAGNOSIS — G4733 Obstructive sleep apnea (adult) (pediatric): ICD-10-CM

## 2019-03-27 DIAGNOSIS — E782 Mixed hyperlipidemia: ICD-10-CM

## 2019-03-27 DIAGNOSIS — I739 Peripheral vascular disease, unspecified: ICD-10-CM

## 2019-03-27 DIAGNOSIS — I5032 Chronic diastolic (congestive) heart failure: ICD-10-CM

## 2019-03-27 DIAGNOSIS — Z8249 Family history of ischemic heart disease and other diseases of the circulatory system: ICD-10-CM

## 2019-03-27 DIAGNOSIS — M4800 Spinal stenosis, site unspecified: ICD-10-CM

## 2019-03-27 DIAGNOSIS — R079 Chest pain, unspecified: ICD-10-CM

## 2019-03-27 NOTE — Progress Notes
Date of Service: 03/27/2019    Gregory Acosta is a 63 y.o. male.     Obtained patient's verbal consent to treat them and their agreement to [facility] financial policy and NPP via this telehealth visit during the [facility] Emergency.        HPI     Mr. Aguila is a 63 year old male with a prior history of coronary artery disease.  Back in 2018, he underwent open heart surgery with a LIMA to the left anterior descending artery, saphenous vein graft to the second obtuse marginal, saphenous vein graft to the first obtuse marginal and saphenous vein graft to the posterior lateral artery.  He has not had any significant exertional symptoms of chest discomfort or exertional fatigue since surgery.  He has been managed very well by his primary care physician is currently taking Repatha for lipid management.  His blood pressure has been under good control as well.  He has had some difficulties controlling his diabetes and his most recent hemoglobin A1c was 9.  Overall, from a cardiovascular standpoint, he is otherwise been stable with no other new subjective complaints.  We did discuss the importance of regular exercise as well today.         Vitals:    03/27/19 1404   Weight: 132.5 kg (292 lb)   Height: 1.829 m (6')   PainSc: Zero     Body mass index is 39.6 kg/m???.     Past Medical History  Patient Active Problem List    Diagnosis Date Noted   ??? Radiculopathy of cervical spine 09/08/2017     LUE pain and decrase in fine motor dexterity.      ??? Morbid obesity with BMI of 40.0-44.9, adult (HCC) 07/21/2017   ??? Hyponatremia 07/17/2017   ??? Chronic diastolic CHF (congestive heart failure), NYHA class 2 (HCC) 07/14/2017   ??? Coronary artery disease involving native coronary artery of native heart without angina pectoris 07/12/2017   ??? Unstable angina (HCC) 07/10/2017   ??? Left ventricular hypertrophy 07/05/2017     08/20/2016 Echo ([facility]): Normal EF, 70%. Moderate concentric left ventricular hypertrophy. No significant valvular abnormalities.      ??? Type 2 diabetes mellitus without complication, without long-term current use of insulin (HCC) 07/05/2017   ??? Essential hypertension 07/05/2017   ??? Mixed hyperlipidemia 07/05/2017   ??? Obstructive sleep apnea syndrome 07/05/2017     Uses CPAP.      ??? Family history of coronary artery disease 07/05/2017   ??? Venous insufficiency of both lower extremities 07/05/2017   ??? Chest pain 07/05/2017         Review of Systems   Constitution: Negative.   HENT: Negative.    Eyes: Negative.    Cardiovascular: Negative.    Respiratory: Negative.    Endocrine: Negative.    Hematologic/Lymphatic: Negative.    Skin: Negative.    Musculoskeletal: Negative.    Gastrointestinal: Negative.    Genitourinary: Negative.    Neurological: Negative.    Psychiatric/Behavioral: Negative.    Allergic/Immunologic: Negative.        Problems Addressed Today  Encounter Diagnoses   Name Primary?   ??? Coronary artery disease involving native coronary artery of native heart without angina pectoris Yes   ??? Chronic diastolic CHF (congestive heart failure), NYHA class 2 (HCC)    ??? Essential hypertension    ??? Mixed hyperlipidemia        Assessment and Plan     1. Coronary  artery disease-I would like for him to remain on an aspirin indefinitely.  Our primary focus is cholesterol control as well as diabetic management.  He is currently taking Repatha and I would like for his LDL to remain less than 70 if possible.  We will plan to obtain the most recent fasting lipid profile results.  2. Hypertension-he is tolerating losartan and Lopressor without any active issues.  Not can make any further adjustments and we will plan to see him in the office in 6 months.    Visit Start Time 1425 Visit End Time 1445.         Current Medications (including today's revisions)  ??? ALPRAZolam (XANAX) 0.25 mg tablet Take 0.25 mg by mouth at bedtime as needed for Anxiety. ??? aspirin EC 81 mg tablet Take 1 tablet by mouth daily. Take with food.   ??? evolocumab (REPATHA SURECLICK) 140 mg/mL injectable PEN Inject 1 mL under the skin every 14 days.   ??? furosemide (LASIX) 40 mg tablet Take 40 mg by mouth every morning.   ??? insulin detemir(+) (LEVEMIR) 100 unit/mL soln Inject 70 Units under the skin once.   ??? insulin lispro(+) (HUMALOG KWIKPEN) 100 unit/mL injection PEN Inject  under the skin three times daily with meals. Sliding Scale.   ??? losartan (COZAAR) 25 mg tablet Take 25 mg by mouth daily.   ??? metFORMIN (GLUCOPHAGE) 1,000 mg tablet Take 1/2 tab with dinner for a week. Then increase to 1/2 tab with breakfast and dinner for a week. Then increase to 1 tab with breakfast and 1 tab with dinner thereafter.   ??? metoprolol tartrate (LOPRESSOR) 25 mg tablet Take 1 tablet by mouth twice daily.   ??? pramipexole (MIRAPEX) 0.25 mg tablet Take 1 tablet by mouth every evening. Take an additional 1 to 2 tablets by mouth at bedtime as needed for restless leg syndrome.

## 2019-03-27 NOTE — Patient Instructions
1. We will plan to see you back in 6 months.  2. We will get your most recent fasting lipid profile.    LDL<70

## 2019-03-28 ENCOUNTER — Encounter: Admit: 2019-03-28 | Discharge: 2019-03-28 | Payer: BC Managed Care – PPO

## 2019-04-07 ENCOUNTER — Encounter: Admit: 2019-04-07 | Discharge: 2019-04-07 | Payer: BC Managed Care – PPO

## 2019-04-07 DIAGNOSIS — E782 Mixed hyperlipidemia: Principal | ICD-10-CM

## 2019-05-04 ENCOUNTER — Encounter: Admit: 2019-05-04 | Discharge: 2019-05-04 | Payer: BC Managed Care – PPO

## 2019-05-11 ENCOUNTER — Encounter: Admit: 2019-05-11 | Discharge: 2019-05-11 | Payer: BC Managed Care – PPO

## 2019-06-02 ENCOUNTER — Encounter: Admit: 2019-06-02 | Discharge: 2019-06-02

## 2019-07-20 ENCOUNTER — Encounter: Admit: 2019-07-20 | Discharge: 2019-07-20

## 2019-07-20 NOTE — Progress Notes
Please send the following records for continuity of care.     Most recent lipid and liver profile, chemistry panel, and thyroid panel.     Please fax to:  Attention Leyl[facility] Tarnow   Fax: [phone]    Thank you,  Zacarias Krauter  [facility]  Cardiovascular Medicine  [address] [place] [address]  Phone: [phone]  Fax: [phone]

## 2019-07-21 ENCOUNTER — Encounter: Admit: 2019-07-21 | Discharge: 2019-07-21

## 2019-07-21 NOTE — Telephone Encounter
Telehealth appoimtment time changed to 12:00pm. Please note One hour before your visit time, you will find a green Begin Video Visit button on your telehealth appointment in [place]. Click the button to join your visit at least 10 minutes prior to the scheduled visit time

## 2019-12-08 ENCOUNTER — Encounter: Admit: 2019-12-08 | Discharge: 2019-12-08 | Payer: BC Managed Care – PPO

## 2019-12-11 ENCOUNTER — Encounter: Admit: 2019-12-11 | Discharge: 2019-12-11 | Payer: BC Managed Care – PPO

## 2019-12-11 ENCOUNTER — Ambulatory Visit: Admit: 2019-12-11 | Discharge: 2019-12-11 | Payer: BC Managed Care – PPO

## 2019-12-11 DIAGNOSIS — G4733 Obstructive sleep apnea (adult) (pediatric): Secondary | ICD-10-CM

## 2019-12-11 DIAGNOSIS — I517 Cardiomegaly: Secondary | ICD-10-CM

## 2019-12-11 DIAGNOSIS — I739 Peripheral vascular disease, unspecified: Secondary | ICD-10-CM

## 2019-12-11 DIAGNOSIS — G2581 Restless legs syndrome: Secondary | ICD-10-CM

## 2019-12-11 DIAGNOSIS — Z8249 Family history of ischemic heart disease and other diseases of the circulatory system: Secondary | ICD-10-CM

## 2019-12-11 DIAGNOSIS — I10 Essential (primary) hypertension: Secondary | ICD-10-CM

## 2019-12-11 DIAGNOSIS — E119 Type 2 diabetes mellitus without complications: Secondary | ICD-10-CM

## 2019-12-11 DIAGNOSIS — E782 Mixed hyperlipidemia: Secondary | ICD-10-CM

## 2019-12-11 DIAGNOSIS — M4800 Spinal stenosis, site unspecified: Secondary | ICD-10-CM

## 2019-12-11 DIAGNOSIS — R079 Chest pain, unspecified: Secondary | ICD-10-CM

## 2019-12-11 NOTE — Patient Instructions
We will plan to get some blood work at the [place] location  CBC, [address] [facility] [place]

## 2020-01-07 ENCOUNTER — Encounter: Admit: 2020-01-07 | Discharge: 2020-01-07 | Payer: BC Managed Care – PPO

## 2020-01-08 ENCOUNTER — Encounter: Admit: 2020-01-08 | Discharge: 2020-01-08 | Payer: BC Managed Care – PPO

## 2020-01-08 ENCOUNTER — Emergency Department: Admit: 2020-01-08 | Discharge: 2020-01-07 | Payer: BC Managed Care – PPO

## 2020-01-08 ENCOUNTER — Emergency Department: Admit: 2020-01-08 | Discharge: 2020-01-08 | Payer: BC Managed Care – PPO

## 2020-01-08 LAB — POC LACTATE: Lab: 1.7 MMOL/L (ref 0.5–2.0)

## 2020-01-08 MED ORDER — FENTANYL CITRATE (PF) 50 MCG/ML IJ SOLN
50 ug | Freq: Once | INTRAVENOUS | 0 refills | Status: CP
Start: 2020-01-08 — End: ?
  Administered 2020-01-08: 07:00:00 50 ug via INTRAVENOUS

## 2020-01-08 MED ORDER — LACTATED RINGERS IV SOLP
500 mL | Freq: Once | INTRAVENOUS | 0 refills | Status: CP
Start: 2020-01-08 — End: ?
  Administered 2020-01-08: 09:00:00 500 mL via INTRAVENOUS

## 2020-01-08 MED ORDER — CLINDAMYCIN IN 5 % DEXTROSE 900 MG/50 ML IV PGBK
900 mg | Freq: Once | INTRAVENOUS | 0 refills | Status: CP
Start: 2020-01-08 — End: ?
  Administered 2020-01-08: 10:00:00 900 mg via INTRAVENOUS

## 2020-01-08 MED ORDER — FENTANYL CITRATE (PF) 50 MCG/ML IJ SOLN
50 ug | Freq: Once | INTRAVENOUS | 0 refills | Status: CP
Start: 2020-01-08 — End: ?

## 2020-01-08 MED ORDER — CLINDAMYCIN HCL 300 MG PO CAP
300 mg | ORAL_CAPSULE | ORAL | 0 refills | Status: AC
Start: 2020-01-08 — End: ?

## 2020-01-08 MED ORDER — SODIUM CHLORIDE 0.9 % IJ SOLN
50 mL | Freq: Once | INTRAVENOUS | 0 refills | Status: CP
Start: 2020-01-08 — End: ?
  Administered 2020-01-08: 07:00:00 50 mL via INTRAVENOUS

## 2020-01-08 MED ORDER — IOHEXOL 350 MG IODINE/ML IV SOLN
100 mL | Freq: Once | INTRAVENOUS | 0 refills | Status: CP
Start: 2020-01-08 — End: ?
  Administered 2020-01-08: 07:00:00 100 mL via INTRAVENOUS

## 2020-01-08 MED ORDER — LORAZEPAM 2 MG/ML IJ SOLN
.5 mg | Freq: Once | INTRAVENOUS | 0 refills | Status: CP
Start: 2020-01-08 — End: ?
  Administered 2020-01-08: 09:00:00 0.5 mg via INTRAVENOUS

## 2020-01-08 MED ORDER — KETAMINE 10 MG/ML IJ SOLN
.5-1.5 mg/kg | INTRAVENOUS | 0 refills | Status: DC | PRN
Start: 2020-01-08 — End: 2020-01-08
  Administered 2020-01-08: 09:00:00 140 mg via INTRAVENOUS

## 2020-01-08 MED ADMIN — WATER FOR IRRIGATION, STERILE IR SOLN [7485]: TOPICAL | @ 09:00:00 | Stop: 2020-01-08 | NDC 00338000404

## 2020-01-08 MED ADMIN — FENTANYL CITRATE (PF) 50 MCG/ML IJ SOLN [3037]: 50 ug | INTRAVENOUS | @ 09:00:00 | Stop: 2020-01-08 | NDC 00409909412

## 2020-01-08 NOTE — ED Notes
Pt presents to the ED with significant other for c/c of RLQ pain. Pt reports that he started noticing swelling in his RLQ of his abd on Thursday after he administered SQ insulin. Pt denies any redness at the site on Thursday but reports increased redness since then. Upon assessment the pt's RLQ is inflamed, red, warm to the touch, but no drainage noted at this time. Pt reports intermittent nausea but denies any vomiting. Pt verbal told this RN that he was worried he has a hernia. Pt denies any problems with BM, urination, or flank pain. Pt was negative for this RN on rebound tenderness. Pt is resting in bed at this time with wife at bedside. Pt is attached to the monitor, monitor is on, and call light within reach. This RN will continue to monitor.     All belongings gathered and placed in belonging bag with patient labels at bedside.  The bag(s) contain(s) the following:     Clothing: white t-shirt, jeans, jacket, x2 shoes.     Pt denies the need to place any items in the safe.  All pt belongings placed in labeled pt bag.

## 2020-01-10 ENCOUNTER — Encounter: Admit: 2020-01-10 | Discharge: 2020-01-10 | Payer: BC Managed Care – PPO

## 2020-01-10 DIAGNOSIS — I10 Essential (primary) hypertension: Secondary | ICD-10-CM

## 2020-01-10 DIAGNOSIS — I2510 Atherosclerotic heart disease of native coronary artery without angina pectoris: Secondary | ICD-10-CM

## 2020-01-11 ENCOUNTER — Encounter: Admit: 2020-01-11 | Discharge: 2020-01-11 | Payer: BC Managed Care – PPO

## 2020-01-11 NOTE — Telephone Encounter
The patients wife called she had several questions in regards to the patients dressing changes and after care from his I&D. How to pack a wound, bathing and s/s of infection gone over with her all questions answered the wife expressed verbal understanding. He has a f/u appt scheduled for the 26th.

## 2020-01-14 ENCOUNTER — Encounter: Admit: 2020-01-14 | Discharge: 2020-01-14 | Payer: BC Managed Care – PPO

## 2020-01-14 NOTE — Progress Notes
Culture results completed for Mr. Baldinger abscess wound. He is on clindamycin for coverage.     Moderate growth -Strep Constellatus  Light growth - Gemella Morbillorum     Heavy growth of Parvimonas Micra  Moderate growth of Fusobacterium Nucleatum  Moderate growth -mixed anaerobic flora.      Discussed with pharmacy as Strep. C is viridian group and is moderately covered with Clindamycin. Gemella M has coverage with clindamycin if tested in vitro which was not completed. Anaerobic growth is covered by clindamycin.     Called patient and states the wound is improving with signs of less swelling pressure, pain, and redness but states his wife is doing more of the packing and treatment. Drainage on dressing is less. However, he takes his last antibiotic tonight.     Called surgical on-call this morning and stated there was no need to add on any antibiotics at this time and the time frame for Clindamycin for this type of wound is typically 7 days. Patient can call the clinic with any wound changes or worsening symptoms. Will notify the patient and have them call the clinic this week if needed. Appt scheduled for the 26th with surgery.

## 2020-01-16 ENCOUNTER — Encounter: Admit: 2020-01-16 | Discharge: 2020-01-16 | Payer: BC Managed Care – PPO

## 2020-01-23 ENCOUNTER — Encounter: Admit: 2020-01-23 | Discharge: 2020-01-23 | Payer: BC Managed Care – PPO

## 2020-01-30 ENCOUNTER — Encounter: Admit: 2020-01-30 | Discharge: 2020-01-30 | Payer: BC Managed Care – PPO

## 2021-12-02 ENCOUNTER — Encounter: Admit: 2021-12-02 | Discharge: 2021-12-02 | Payer: BC Managed Care – PPO

## 2021-12-02 ENCOUNTER — Ambulatory Visit: Admit: 2021-12-02 | Discharge: 2021-12-02 | Payer: BC Managed Care – PPO

## 2021-12-02 DIAGNOSIS — R0602 Shortness of breath: Secondary | ICD-10-CM

## 2021-12-02 DIAGNOSIS — E785 Hyperlipidemia, unspecified: Secondary | ICD-10-CM

## 2021-12-02 DIAGNOSIS — I2510 Atherosclerotic heart disease of native coronary artery without angina pectoris: Secondary | ICD-10-CM

## 2021-12-02 DIAGNOSIS — R600 Localized edema: Secondary | ICD-10-CM

## 2021-12-02 DIAGNOSIS — I10 Essential (primary) hypertension: Secondary | ICD-10-CM

## 2021-12-03 ENCOUNTER — Encounter: Admit: 2021-12-03 | Discharge: 2021-12-03 | Payer: BC Managed Care – PPO

## 2021-12-03 ENCOUNTER — Ambulatory Visit: Admit: 2021-12-03 | Discharge: 2021-12-03 | Payer: BC Managed Care – PPO

## 2021-12-03 DIAGNOSIS — I2510 Atherosclerotic heart disease of native coronary artery without angina pectoris: Secondary | ICD-10-CM

## 2021-12-03 DIAGNOSIS — I10 Essential (primary) hypertension: Secondary | ICD-10-CM

## 2021-12-03 DIAGNOSIS — R0609 Other forms of dyspnea: Secondary | ICD-10-CM

## 2021-12-03 DIAGNOSIS — E785 Hyperlipidemia, unspecified: Secondary | ICD-10-CM

## 2021-12-04 ENCOUNTER — Encounter: Admit: 2021-12-04 | Discharge: 2021-12-04 | Payer: BC Managed Care – PPO

## 2021-12-04 NOTE — Telephone Encounter
-----   Message from Renard Matter, RN sent at 12/04/2021  2:04 PM CST -----    ----- Message -----  From: Altamease Oiler, MD  Sent: 12/04/2021   1:35 PM CST  To: [facility] Nurse [facility] Team [place]    Please let him know that his stress test looked okay.  Nothing to suggest blockages in his heart arteries.  Normal heart pump function.

## 2022-05-01 ENCOUNTER — Encounter: Admit: 2022-05-01 | Discharge: 2022-05-01 | Payer: BC Managed Care – PPO

## 2022-05-01 DIAGNOSIS — R079 Chest pain, unspecified: Secondary | ICD-10-CM

## 2022-05-01 DIAGNOSIS — M4800 Spinal stenosis, site unspecified: Secondary | ICD-10-CM

## 2022-05-01 DIAGNOSIS — I10 Essential (primary) hypertension: Secondary | ICD-10-CM

## 2022-05-01 DIAGNOSIS — I517 Cardiomegaly: Secondary | ICD-10-CM

## 2022-05-01 DIAGNOSIS — E782 Mixed hyperlipidemia: Secondary | ICD-10-CM

## 2022-05-01 DIAGNOSIS — I739 Peripheral vascular disease, unspecified: Secondary | ICD-10-CM

## 2022-05-01 DIAGNOSIS — G4733 Obstructive sleep apnea (adult) (pediatric): Secondary | ICD-10-CM

## 2022-05-01 DIAGNOSIS — E119 Type 2 diabetes mellitus without complications: Secondary | ICD-10-CM

## 2022-05-01 DIAGNOSIS — Z8249 Family history of ischemic heart disease and other diseases of the circulatory system: Secondary | ICD-10-CM

## 2022-05-01 DIAGNOSIS — G2581 Restless legs syndrome: Secondary | ICD-10-CM

## 2022-05-02 ENCOUNTER — Encounter: Admit: 2022-05-02 | Discharge: 2022-05-02 | Payer: BC Managed Care – PPO

## 2022-05-02 ENCOUNTER — Emergency Department: Admit: 2022-05-02 | Discharge: 2022-05-02 | Payer: BC Managed Care – PPO

## 2022-05-02 ENCOUNTER — Emergency Department: Admit: 2022-05-02 | Discharge: 2022-05-01 | Payer: BC Managed Care – PPO

## 2022-05-02 ENCOUNTER — Observation Stay: Admit: 2022-05-02 | Discharge: 2022-05-02 | Payer: BC Managed Care – PPO

## 2022-05-02 DIAGNOSIS — M4800 Spinal stenosis, site unspecified: Secondary | ICD-10-CM

## 2022-05-02 DIAGNOSIS — E119 Type 2 diabetes mellitus without complications: Secondary | ICD-10-CM

## 2022-05-02 DIAGNOSIS — E782 Mixed hyperlipidemia: Secondary | ICD-10-CM

## 2022-05-02 DIAGNOSIS — G2581 Restless legs syndrome: Secondary | ICD-10-CM

## 2022-05-02 DIAGNOSIS — R079 Chest pain, unspecified: Secondary | ICD-10-CM

## 2022-05-02 DIAGNOSIS — Z8249 Family history of ischemic heart disease and other diseases of the circulatory system: Secondary | ICD-10-CM

## 2022-05-02 DIAGNOSIS — I739 Peripheral vascular disease, unspecified: Secondary | ICD-10-CM

## 2022-05-02 DIAGNOSIS — G4733 Obstructive sleep apnea (adult) (pediatric): Secondary | ICD-10-CM

## 2022-05-02 DIAGNOSIS — I517 Cardiomegaly: Secondary | ICD-10-CM

## 2022-05-02 DIAGNOSIS — I10 Essential (primary) hypertension: Secondary | ICD-10-CM

## 2022-05-02 MED ADMIN — POTASSIUM CHLORIDE IN WATER 10 MEQ/50 ML IV PGBK [11075]: 10 meq | INTRAVENOUS | @ 20:00:00 | Stop: 2022-05-02 | NDC 00338070541

## 2022-05-02 MED ADMIN — BUDESONIDE-FORMOTEROL 80-4.5 MCG/ACTUATION IN HFAA [163005]: 2 | RESPIRATORY_TRACT | @ 22:00:00 | NDC 00186037228

## 2022-05-02 MED ADMIN — POTASSIUM CHLORIDE IN WATER 10 MEQ/50 ML IV PGBK [11075]: 10 meq | INTRAVENOUS | @ 17:00:00 | Stop: 2022-05-02 | NDC 00338070541

## 2022-05-02 MED ADMIN — INSULIN GLARGINE 100 UNIT/ML (3 ML) SC INJ PEN [163596]: 40 [IU] | SUBCUTANEOUS | @ 16:00:00 | NDC 00088221905

## 2022-05-02 MED ADMIN — POTASSIUM CHLORIDE IN WATER 10 MEQ/50 ML IV PGBK [11075]: 10 meq | INTRAVENOUS | @ 12:00:00 | Stop: 2022-05-02 | NDC 00338070541

## 2022-05-02 MED ADMIN — POTASSIUM CHLORIDE IN WATER 10 MEQ/50 ML IV PGBK [11075]: 10 meq | INTRAVENOUS | @ 15:00:00 | Stop: 2022-05-02 | NDC 00338070541

## 2022-05-02 MED ADMIN — INSULIN ASPART 100 UNIT/ML SC FLEXPEN [87504]: 12 [IU] | SUBCUTANEOUS | @ 16:00:00 | NDC 00169633910

## 2022-05-02 MED ADMIN — PERFLUTREN LIPID MICROSPHERES 1.1 MG/ML IV SUSP [79178]: 2 mL | INTRAVENOUS | @ 20:00:00 | Stop: 2022-05-02 | NDC 11994001116

## 2022-05-02 MED ADMIN — ASPIRIN 81 MG PO TBEC [14113]: 81 mg | ORAL | @ 14:00:00 | NDC 00536123441

## 2022-05-02 MED ADMIN — ENOXAPARIN 40 MG/0.4 ML SC SYRG [85052]: 40 mg | SUBCUTANEOUS | @ 14:00:00 | NDC 00781324602

## 2022-05-02 MED ADMIN — POTASSIUM CHLORIDE 20 MEQ PO TBTQ [35943]: 60 meq | ORAL | @ 09:00:00 | Stop: 2022-05-02 | NDC 00832532511

## 2022-05-02 MED ADMIN — LACTATED RINGERS IV SOLP [4318]: 1000 mL | INTRAVENOUS | @ 12:00:00 | Stop: 2022-05-02 | NDC 00338011704

## 2022-05-02 MED ADMIN — METOPROLOL TARTRATE 25 MG PO TAB [37637]: 25 mg | ORAL | @ 14:00:00 | NDC 62584026511

## 2022-05-03 MED ADMIN — POTASSIUM CHLORIDE 20 MEQ PO TBTQ [35943]: 60 meq | ORAL | @ 01:00:00 | Stop: 2022-05-03 | NDC 00832532511

## 2022-05-03 MED ADMIN — ASPIRIN 81 MG PO TBEC [14113]: 81 mg | ORAL | @ 15:00:00 | Stop: 2022-05-03 | NDC 00536123441

## 2022-05-03 MED ADMIN — INSULIN ASPART 100 UNIT/ML SC FLEXPEN [87504]: 8 [IU] | SUBCUTANEOUS | @ 13:00:00 | Stop: 2022-05-03 | NDC 00169633910

## 2022-05-03 MED ADMIN — PRAMIPEXOLE 0.25 MG PO TAB [81284]: 0.25 mg | ORAL | @ 01:00:00 | NDC 00904670461

## 2022-05-03 MED ADMIN — METOPROLOL TARTRATE 25 MG PO TAB [37637]: 25 mg | ORAL | @ 15:00:00 | Stop: 2022-05-03 | NDC 62584026511

## 2022-05-03 MED ADMIN — MELATONIN 5 MG PO TAB [168576]: 5 mg | ORAL | @ 02:00:00 | NDC 77333052025

## 2022-05-03 MED ADMIN — INSULIN GLARGINE 100 UNIT/ML (3 ML) SC INJ PEN [163596]: 40 [IU] | SUBCUTANEOUS | @ 02:00:00 | NDC 00088221905

## 2022-05-03 MED ADMIN — METOPROLOL TARTRATE 25 MG PO TAB [37637]: 25 mg | ORAL | @ 02:00:00 | NDC 62584026511

## 2022-05-03 MED ADMIN — BUDESONIDE-FORMOTEROL 80-4.5 MCG/ACTUATION IN HFAA [163005]: 2 | RESPIRATORY_TRACT | @ 11:00:00 | Stop: 2022-05-03 | NDC 00186037228

## 2022-05-04 ENCOUNTER — Encounter: Admit: 2022-05-04 | Discharge: 2022-05-04 | Payer: BC Managed Care – PPO

## 2022-05-04 NOTE — Telephone Encounter
Patient Discharge Date from hospital: 05/03/22  Date Call Attempted: 05/04/22  Number of Attempts: 1  Date Call Completed:  05/04/22    Call completed with wife, [name].     Two Patient Identifier complete: Yes [x]     Next Appointment    Next follow-up appointment on 05/06/22 at 1000 with Fara Boros, APRN    Transportation    Does pt have transportation?  Yes [x]     No []    NA []      Home Health    No    Medications    Does pt have all medications? Yes  [x]     No []     Wife states her dc instructions said to ask about his Metformin dose. Advised her to contact PCP who manages his diabetes to verify current dose.    STOP taking:  metOLazone 5 mg tablet (ZAROXOLYN)  ASK how to take:  metFORMIN 1,000 mg tablet (GLUCOPHAGE)    Diet    - 200 mg cholesterol, 2 G Na. Pt is also following a diabetic diet.    Is patient following prescribed diet and restrictions?  Yes [x]    No []      Scale/Weight    Does pt have a scale at home?  Yes [x]    No []     Did pt weight first thing this morning?  Yes [x]    No []      If yes, what was pt's first morning weight today?  300.0    Signs and Symptoms    Wife reports the following symptoms:     No BLE edema or upper abdominal bloating/tightness. Wife states SOA is not improved since ED visit.     Wife verbalized understanding of signs and symptoms of HF and when to contact a provider or seek immediate assistance at the ER. Pt does cough sometimes. Cough is sometimes productive.    Was pt given zone sheet? Yes []   No [x]     Intervention(s)    Wife was concerned about CXR result. Informed her she would be able to view the CXR result and interpretation through MyChart. She has not been able to do this yet. Reviewed interpretation and advised her to follow up with PCP who is managing his COPD at this times.     Wife educated on the importance of weighing daily first thing in the morning before dressing, before eating or drinking, and after voiding using the same scale in the same location and write results down in note pad or log. Notify us for weight gains of 3 lbs in one day or 5 lbs in one week. Notify your provider for increased SOA.  Notify your provider for swelling or increased swelling in BLE or abdominal fullness/bloating. Advised to check B/P at least once daily. Check 1-2 hours after am meds. Log results. Check B/P other times if feeling lightheaded, dizzy, or if you feel your heart rate is elevated. Document the time you checked and any symptoms you may be feeling at the time. Call 911 for sudden, severe chest/pain pressure/SOA develops. Be sure to keep your follow up appointment and bring your weight logs, B/P logs, and medication list with you to your appointment. Call us at [phone] if you have any questions.       Plan of Care    Continued education needed for heart failure symptom management and when to contact our office.

## 2022-05-05 ENCOUNTER — Encounter: Admit: 2022-05-05 | Discharge: 2022-05-05 | Payer: BC Managed Care – PPO

## 2022-05-06 ENCOUNTER — Encounter: Admit: 2022-05-06 | Discharge: 2022-05-06 | Payer: BC Managed Care – PPO

## 2022-05-06 NOTE — Telephone Encounter
05-06-2022 Per Task Message, request faxed to [facility], [place],   [phone], (P) [phone], clp        PCP, Dr. Rockwell [place], [place], [address] [place], [place] [address], [phone]

## 2022-05-18 ENCOUNTER — Encounter: Admit: 2022-05-18 | Discharge: 2022-05-18 | Payer: BC Managed Care – PPO

## 2022-05-28 ENCOUNTER — Encounter: Admit: 2022-05-28 | Discharge: 2022-05-28 | Payer: BC Managed Care – PPO

## 2022-06-12 ENCOUNTER — Encounter: Admit: 2022-06-12 | Discharge: 2022-06-12 | Payer: BC Managed Care – PPO

## 2022-06-16 ENCOUNTER — Encounter: Admit: 2022-06-16 | Discharge: 2022-06-16 | Payer: BC Managed Care – PPO

## 2022-06-16 DIAGNOSIS — G2581 Restless legs syndrome: Secondary | ICD-10-CM

## 2022-06-16 DIAGNOSIS — I739 Peripheral vascular disease, unspecified: Secondary | ICD-10-CM

## 2022-06-16 DIAGNOSIS — Z8249 Family history of ischemic heart disease and other diseases of the circulatory system: Secondary | ICD-10-CM

## 2022-06-16 DIAGNOSIS — I517 Cardiomegaly: Secondary | ICD-10-CM

## 2022-06-16 DIAGNOSIS — E119 Type 2 diabetes mellitus without complications: Secondary | ICD-10-CM

## 2022-06-16 DIAGNOSIS — R079 Chest pain, unspecified: Secondary | ICD-10-CM

## 2022-06-16 DIAGNOSIS — I10 Essential (primary) hypertension: Secondary | ICD-10-CM

## 2022-06-16 DIAGNOSIS — G4733 Obstructive sleep apnea (adult) (pediatric): Secondary | ICD-10-CM

## 2022-06-16 DIAGNOSIS — I2510 Atherosclerotic heart disease of native coronary artery without angina pectoris: Secondary | ICD-10-CM

## 2022-06-16 DIAGNOSIS — E782 Mixed hyperlipidemia: Secondary | ICD-10-CM

## 2022-06-16 DIAGNOSIS — I5032 Chronic diastolic (congestive) heart failure: Secondary | ICD-10-CM

## 2022-06-16 DIAGNOSIS — M4800 Spinal stenosis, site unspecified: Secondary | ICD-10-CM

## 2022-06-16 MED ORDER — ICOSAPENT ETHYL 1 GRAM PO CAP
2 g | ORAL_CAPSULE | Freq: Two times a day (BID) | ORAL | 3 refills
Start: 2022-06-16 — End: ?

## 2022-06-16 MED ORDER — ICOSAPENT ETHYL 1 GRAM PO CAP
2 g | ORAL_CAPSULE | Freq: Two times a day (BID) | ORAL | 3 refills | Status: AC
Start: 2022-06-16 — End: ?

## 2022-06-16 MED ORDER — SPIRONOLACTONE 25 MG PO TAB
25 mg | ORAL_TABLET | Freq: Every day | ORAL | 1 refills | 90.00000 days | Status: AC
Start: 2022-06-16 — End: ?

## 2022-06-16 NOTE — Patient Instructions
Thank you for visiting our office today.    We would like to make the following medication adjustments:      Vascepa 2gm twice daily  Spironolactone 25mg  daily    Stop Potassium supplement     Lab draw in 1 week      Otherwise continue the same medications as you have been doing.          We will be pursuing the following tests after your appointment today:       Orders Placed This Encounter    LIPID PROFILE    BASIC METABOLIC PANEL    icosapent ethyL (VASCEPA) 1 gram capsule    spironolactone (ALDACTONE) 25 mg tablet     Fasting labs in 3 months     We will plan to see you back in 6  months.  Please call in the meantime with any questions or concerns.        Please allow 5-7 business days for our providers to review your results. All normal results will go to MyChart. If you do not have Mychart, it is strongly recommended to get this so you can easily view all your results. If you do not have mychart, we will attempt to call you once with normal lab and testing results. If we cannot reach you by phone with normal results, we will send you a letter.  If you have not heard the results of your testing after one week please give [place] a call.       Your Cardiovascular [facility] [place] Team Gabriel Rung, Brett Canales, Pilar Jarvis, and [place])  phone number is [phone].

## 2022-10-28 ENCOUNTER — Encounter: Admit: 2022-10-28 | Discharge: 2022-10-28 | Payer: BC Managed Care – PPO

## 2022-12-21 ENCOUNTER — Encounter: Admit: 2022-12-21 | Discharge: 2022-12-21 | Payer: BC Managed Care – PPO

## 2022-12-21 MED ORDER — SPIRONOLACTONE 25 MG PO TAB
25 mg | ORAL_TABLET | Freq: Every day | ORAL | 5 refills
Start: 2022-12-21 — End: ?

## 2022-12-22 ENCOUNTER — Encounter: Admit: 2022-12-22 | Discharge: 2022-12-22 | Payer: BC Managed Care – PPO

## 2022-12-29 ENCOUNTER — Encounter: Admit: 2022-12-29 | Discharge: 2022-12-29 | Payer: BC Managed Care – PPO

## 2022-12-29 DIAGNOSIS — E782 Mixed hyperlipidemia: Secondary | ICD-10-CM

## 2022-12-29 DIAGNOSIS — I2510 Atherosclerotic heart disease of native coronary artery without angina pectoris: Secondary | ICD-10-CM

## 2022-12-29 DIAGNOSIS — I5032 Chronic diastolic (congestive) heart failure: Secondary | ICD-10-CM

## 2022-12-29 DIAGNOSIS — I10 Essential (primary) hypertension: Secondary | ICD-10-CM

## 2022-12-29 DIAGNOSIS — E119 Type 2 diabetes mellitus without complications: Secondary | ICD-10-CM

## 2022-12-29 DIAGNOSIS — Z8249 Family history of ischemic heart disease and other diseases of the circulatory system: Secondary | ICD-10-CM

## 2022-12-29 DIAGNOSIS — G4733 Obstructive sleep apnea (adult) (pediatric): Secondary | ICD-10-CM

## 2022-12-29 DIAGNOSIS — I517 Cardiomegaly: Secondary | ICD-10-CM

## 2023-01-04 ENCOUNTER — Encounter: Admit: 2023-01-04 | Discharge: 2023-01-04 | Payer: BC Managed Care – PPO

## 2023-02-12 ENCOUNTER — Encounter: Admit: 2023-02-12 | Discharge: 2023-02-12 | Payer: BC Managed Care – PPO

## 2023-02-12 DIAGNOSIS — I2510 Atherosclerotic heart disease of native coronary artery without angina pectoris: Secondary | ICD-10-CM

## 2023-02-12 DIAGNOSIS — E782 Mixed hyperlipidemia: Secondary | ICD-10-CM

## 2023-02-12 DIAGNOSIS — E119 Type 2 diabetes mellitus without complications: Secondary | ICD-10-CM

## 2023-02-12 DIAGNOSIS — I517 Cardiomegaly: Secondary | ICD-10-CM

## 2023-02-12 LAB — LIPID PROFILE
CHOLESTEROL: 426 — ABNORMAL HIGH (ref <200)
HDL: 34 — ABNORMAL LOW (ref >40)
TRIGLYCERIDES: 340 — ABNORMAL HIGH (ref <150)

## 2023-02-16 ENCOUNTER — Encounter: Admit: 2023-02-16 | Discharge: 2023-02-16 | Payer: MEDICARE

## 2023-02-16 ENCOUNTER — Encounter: Admit: 2023-02-16 | Discharge: 2023-02-16 | Payer: BC Managed Care – PPO

## 2023-02-16 DIAGNOSIS — E782 Mixed hyperlipidemia: Secondary | ICD-10-CM

## 2023-02-16 DIAGNOSIS — M4800 Spinal stenosis, site unspecified: Secondary | ICD-10-CM

## 2023-02-16 DIAGNOSIS — I5032 Chronic diastolic (congestive) heart failure: Secondary | ICD-10-CM

## 2023-02-16 DIAGNOSIS — G2581 Restless legs syndrome: Secondary | ICD-10-CM

## 2023-02-16 DIAGNOSIS — E119 Type 2 diabetes mellitus without complications: Secondary | ICD-10-CM

## 2023-02-16 DIAGNOSIS — I10 Essential (primary) hypertension: Secondary | ICD-10-CM

## 2023-02-16 DIAGNOSIS — I739 Peripheral vascular disease, unspecified: Secondary | ICD-10-CM

## 2023-02-16 DIAGNOSIS — I517 Cardiomegaly: Secondary | ICD-10-CM

## 2023-02-16 DIAGNOSIS — I2510 Atherosclerotic heart disease of native coronary artery without angina pectoris: Secondary | ICD-10-CM

## 2023-02-16 DIAGNOSIS — R079 Chest pain, unspecified: Secondary | ICD-10-CM

## 2023-02-16 DIAGNOSIS — G4733 Obstructive sleep apnea (adult) (pediatric): Secondary | ICD-10-CM

## 2023-02-16 DIAGNOSIS — Z8249 Family history of ischemic heart disease and other diseases of the circulatory system: Secondary | ICD-10-CM

## 2023-02-16 NOTE — Progress Notes
Date of Service: 02/16/2023    Gregory Acosta is a 67 y.o. male.       Chief Complaint: Follow-up    History of Present Illness:     I had the pleasure of seeing Gregory Acosta in our [place] office this morning for establishment of cardiovascular care.  He previously followed with Idamae Lusher, MD.      As you know, Gregory Acosta is a very pleasant 67 year old gentleman with history of COPD, obstructive sleep apnea, obesity, hypertension, dyslipidemia, type 2 diabetes mellitus, heart failure with preserved ejection fraction, and coronary artery disease, S/P coronary artery bypass surgery in 2018 with LIMA-LAD, SVG to OM 1, and SVG to posterolateral artery.     At our last visit Gregory Acosta was still experiencing exertional shortness of breath.  We felt that this may be more related to his lungs and decompensated heart failure as he been aggressively diuresed in the hospital in May 2023 to the point where he developed hypotension and acute kidney injury.  Despite this he had continued to have similar shortness of breath.  We added spironolactone at her last visit and it sounds like he had felt a little bit better in the subsequent months.  Unfortunately in December 2023 Gregory Acosta had multiple viral upper respiratory illnesses.  He spent some time in the hospital with RSV and again with influenza.  It sounds like he was extremely short of breath with both of these infections.  He is just now starting to get back to his normal.  He actually feels like his shortness of breath may be a little better than it was prior to the infections.  He has a consultation with Dr. Doran Heater in June to discuss treatment for his COPD.    At this point Gregory Acosta is not having any exertional chest pain.  His shortness of breath is stable.  No lightheadedness, dizziness, palpitations, orthopnea, paroxysmal nocturnal dyspnea, or lower extremity swelling.     Most recent cardiovascular testing includes a transthoracic echocardiogram from May 02, 2022. Normal left ventricular size and systolic function.  Ejection fraction 60%.  No regional wall motion abnormalities.  Moderate concentric left ventricular hypertrophy.  Trivial aortic valve regurgitation.  Normal right ventricular size and systolic function.  Most recent ischemic evaluation was a stress myocardial perfusion study on 12/03/2021.  No evidence of myocardial ischemia.  EF 56%.  Overall low risk study.           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       Morbid obesity with BMI of 40.0-44.9, adult (HCC) 07/21/2017    Hyponatremia 07/17/2017    Chronic diastolic CHF (congestive heart failure), NYHA class 2 (HCC) 07/14/2017    Coronary artery disease involving native coronary artery of native heart without angina pectoris 07/12/2017    Unstable angina (HCC) 07/10/2017    Left ventricular hypertrophy 07/05/2017     08/20/2016 Echo ([facility]): Normal EF, 70%. Moderate concentric left ventricular hypertrophy. No significant valvular abnormalities.       Type 2 diabetes mellitus without complication, without long-term current use of insulin (HCC) 07/05/2017    Essential hypertension 07/05/2017    Mixed hyperlipidemia 07/05/2017    Obstructive sleep apnea syndrome 07/05/2017     Uses CPAP.  Family history of coronary artery disease 07/05/2017    Venous insufficiency of both lower extremities 07/05/2017    Chest pain 07/05/2017       Review of Systems   Constitutional: Negative.   HENT: Negative.     Eyes: Negative.    Cardiovascular: Negative.    Respiratory: Negative.     Endocrine: Negative.    Hematologic/Lymphatic: Negative.    Skin: Negative.    Musculoskeletal: Negative.    Gastrointestinal: Negative.    Genitourinary: Negative.    Neurological: Negative.    Psychiatric/Behavioral: Negative.     Allergic/Immunologic: Negative.        Vitals:    02/16/23 0850   BP: 138/78   BP Source: Arm, Left Upper   Pulse: 89   SpO2: 97%   O2 Device: None (Room air)   PainSc: Zero   Weight: (!) 141.1 kg (311 lb)   Height: 182.9 cm (6')     Body mass index is 42.18 kg/m?Marland Kitchen    Physical Examination:  General Appearance: No acute distress. Fully alert and oriented.  Skin: Warm. No ulcers or xanthomas.   HEENT: Grossly unremarkable. Lips and oral mucosa without pallor or cyanosis. Moist mucous membranes.   Neck Veins: Normal jugular venous pressure. Neck veins are not distended.  Carotid Arteries: Normal carotid upstroke bilaterally. No bruits.  Chest Inspection: Midline scar consistent with prior sternotomy.  Auscultation/Percussion: Normal respiratory effort. Lungs clear to auscultation bilaterally. No wheezes, rales, or rhonchi.    Cardiac Rhythm: Regular rhythm. Normal rate.  Cardiac Auscultation: Normal S1 & S2. No S3 or S4. No rub.  Murmurs: No cardiac murmurs.  Peripheral Circulation: Normal peripheral circulation.   Abdominal Aorta: No abdominal aortic bruit.  Extremities: Appropriately warm to touch.  Mild lower extremity edema.  Abdominal Exam: Soft, non-tender. No masses, no organomegaly. Normal bowel sounds.  Neurologic Exam: Neurological assessment grossly intact.       Assessment and Plan:  Exertional dyspnea: Gregory Acosta continues to have issues with exertional dyspnea although he thinks it may be a little bit better here recently.  He can walk about 70 yards before having to stop and catch his breath.  In December he was diuresed for some mild lower extremity swelling and developed hypotension and acute kidney injury.  He has minimal lower extremity swelling on exam today although his weight is 311 pounds.  Previously we felt like his dry weight was around 295 pounds.  It is tough to know if this is caloric weight gain or volume related.  As he feels like his symptoms are better I am going to continue his current dose of furosemide, spironolactone, and SGLT2 inhibitor.  He has evaluation with Dr. Doran Heater in June.  I think will be helpful to get his thoughts on treatment for COPD moving forward.  If despite adequate treatment for COPD Gregory Acosta is still experiencing exertional shortness of breath and I would recommend repeating a stress test and probably pursuing a right heart catheterization so that we can accurately determine his filling pressures.  Coronary artery disease:  Stable.  He is not having exertional chest pain.  Continue aspirin 81 mg daily.  Risk factor modification as below.  Heart failure with preserved ejection fraction: Recently initiated spironolactone 25 mg daily.  Seems to be doing well with this.  He is already on Empagliflozin.  Dry weight is around 295 pounds in the past although he is 311 pounds today.  Further management as outlined above.  Hypertension: Reportedly  at goal at home.  Looks great in the office today.  Continue current medications.   Dyslipidemia:  Uncontrolled.  He has been intolerant to several statins as well as ezetimibe and niacin in the past.  PCSK9 inhibitor was unaffordable.  As such, we have no treatment for his cholesterol.  His triglycerides are markedly elevated.  He is currently on Vascepa 2 g twice daily which really has not had a huge impact on his triglycerides.  We talked about dietary modifications.    I really enjoyed meeting Gregory Acosta and I appreciate the opportunity to take part in his care.  I look forward to seeing him back in about 6 months.  If I can be of any assistance in the interim, please do not hesitate to reach out with questions or concerns.         Total time spent on today's office visit was 35 minutes. This includes face-to-face in person visit with patient as well as non face-to-face time including review of the electronic medical record, outside records, labs, radiologic studies, cardiovascular studies, formulation of treatment plan, after visit summary, future disposition, personal discussions, and documentation.    Current Medications (including today's revisions)   albuterol-ipratropium (DUONEB) 0.5 mg-3 mg(2.5 mg base)/3 mL nebulizer solution Inhale 3 mL solution by nebulizer as directed three times daily.    ALPRAZolam (XANAX) 0.25 mg tablet Take one tablet by mouth at bedtime as needed for Anxiety.    ascorbic acid (vitamin C) 250 mg tablet Take one tablet by mouth daily.    aspirin EC 81 mg tablet Take 1 tablet by mouth daily. Take with food.    CHOLEcalciferoL (vitamin D3) 400 unit tablet Take one tablet by mouth daily.    cyclobenzaprine (FLEXERIL) 10 mg tablet Take one tablet by mouth at bedtime as needed for Muscle Cramps.    empagliflozin (JARDIANCE) 25 mg tablet Take one tablet by mouth daily.    furosemide (LASIX) 40 mg tablet Take one tablet by mouth twice daily.    ibuprofen (ADVIL) 200 mg tablet Take two tablets by mouth every 8 hours as needed for Pain. Take with food.    icosapent ethyL (VASCEPA) 1 gram capsule Take two capsules by mouth twice daily with meals.    insulin detemir(+) (LEVEMIR) 100 unit/mL soln Inject eighty Units under the skin twice daily.    insulin lispro(+) (HUMALOG KWIKPEN) 100 unit/mL injection PEN Inject sixty Units under the skin three times daily with meals. Sliding Scale.    losartan (COZAAR) 50 mg tablet Take one tablet by mouth daily.    Magnesium 250 mg tab Take one tablet by mouth daily.    metFORMIN (GLUCOPHAGE) 1,000 mg tablet Take 1/2 tab with dinner for a week. Then increase to 1/2 tab with breakfast and dinner for a week. Then increase to 1 tab with breakfast and 1 tab with dinner thereafter. (Patient taking differently: Take one tablet by mouth twice daily with meals.)    metoprolol tartrate (LOPRESSOR) 25 mg tablet Take 1 tablet by mouth twice daily.    pramipexole (MIRAPEX) 0.5 mg tablet Take one tablet by mouth twice daily. Take 1 tablet by mouth every evening. Take an additional 1 to 2 tablets by mouth at bedtime as needed for restless leg syndrome.    spironolactone (ALDACTONE) 25 mg tablet TAKE ONE TABLET BY MOUTH DAILY. TAKE WITH FOOD.    VITAMIN A PO Take  by mouth.

## 2023-06-16 ENCOUNTER — Encounter: Admit: 2023-06-16 | Discharge: 2023-06-16 | Payer: MEDICARE

## 2023-06-16 ENCOUNTER — Ambulatory Visit: Admit: 2023-06-16 | Discharge: 2023-06-16 | Payer: MEDICARE

## 2023-06-16 DIAGNOSIS — N179 Acute kidney failure, unspecified: Secondary | ICD-10-CM

## 2023-08-02 ENCOUNTER — Encounter: Admit: 2023-08-02 | Discharge: 2023-08-02 | Payer: MEDICARE

## 2023-08-09 ENCOUNTER — Encounter: Admit: 2023-08-09 | Discharge: 2023-08-09 | Payer: MEDICARE

## 2023-10-27 ENCOUNTER — Encounter: Admit: 2023-10-27 | Discharge: 2023-10-27 | Payer: MEDICARE

## 2023-10-28 ENCOUNTER — Encounter: Admit: 2023-10-28 | Discharge: 2023-10-28 | Payer: MEDICARE

## 2023-11-02 ENCOUNTER — Encounter: Admit: 2023-11-02 | Discharge: 2023-11-02 | Payer: MEDICARE

## 2023-11-02 ENCOUNTER — Ambulatory Visit: Admit: 2023-11-02 | Discharge: 2023-11-02 | Payer: MEDICARE

## 2023-11-22 ENCOUNTER — Encounter: Admit: 2023-11-22 | Discharge: 2023-11-22 | Payer: MEDICARE

## 2023-11-22 MED ORDER — SPIRONOLACTONE 25 MG PO TAB
25 mg | ORAL_TABLET | Freq: Every day | ORAL | 0 refills | 90.00000 days | Status: AC
Start: 2023-11-22 — End: ?

## 2024-03-25 ENCOUNTER — Encounter: Admit: 2024-03-25 | Discharge: 2024-03-25

## 2024-03-27 ENCOUNTER — Encounter: Admit: 2024-03-27 | Discharge: 2024-03-27

## 2024-04-19 ENCOUNTER — Encounter: Admit: 2024-04-19 | Discharge: 2024-04-19 | Payer: MEDICARE

## 2024-06-14 ENCOUNTER — Encounter: Admit: 2024-06-14 | Discharge: 2024-06-14 | Payer: MEDICARE

## 2024-06-27 ENCOUNTER — Encounter: Admit: 2024-06-27 | Discharge: 2024-06-27 | Payer: MEDICARE

## 2024-07-04 ENCOUNTER — Encounter: Admit: 2024-07-04 | Discharge: 2024-07-04 | Payer: MEDICARE

## 2024-11-06 ENCOUNTER — Encounter: Admit: 2024-11-06 | Discharge: 2024-11-06 | Payer: MEDICARE

## 2024-11-08 ENCOUNTER — Encounter: Admit: 2024-11-08 | Discharge: 2024-11-08 | Payer: MEDICARE

## 2024-11-09 ENCOUNTER — Encounter: Admit: 2024-11-09 | Discharge: 2024-11-09 | Payer: MEDICARE

## 2024-11-09 NOTE — Telephone Encounter [36]
 11/09/24 Records received from [facility] scanned to chart. CRM

## 2024-11-14 ENCOUNTER — Encounter: Admit: 2024-11-14 | Discharge: 2024-11-14 | Payer: MEDICARE
